# Patient Record
Sex: Male | Born: 1945 | Hispanic: No | Smoking: Current some day smoker
Health system: Southern US, Community
[De-identification: ages and names within clinical notes are randomized; demographics above are authoritative.]

## PROBLEM LIST (undated history)

## (undated) DIAGNOSIS — Z8673 Personal history of transient ischemic attack (TIA), and cerebral infarction without residual deficits: Secondary | ICD-10-CM

## (undated) DIAGNOSIS — J449 Chronic obstructive pulmonary disease, unspecified: Secondary | ICD-10-CM

## (undated) DIAGNOSIS — D72829 Elevated white blood cell count, unspecified: Secondary | ICD-10-CM

## (undated) DIAGNOSIS — I252 Old myocardial infarction: Secondary | ICD-10-CM

## (undated) DIAGNOSIS — E785 Hyperlipidemia, unspecified: Secondary | ICD-10-CM

## (undated) DIAGNOSIS — E1140 Type 2 diabetes mellitus with diabetic neuropathy, unspecified: Secondary | ICD-10-CM

## (undated) DIAGNOSIS — Z86718 Personal history of other venous thrombosis and embolism: Secondary | ICD-10-CM

## (undated) DIAGNOSIS — D638 Anemia in other chronic diseases classified elsewhere: Secondary | ICD-10-CM

## (undated) DIAGNOSIS — I10 Essential (primary) hypertension: Secondary | ICD-10-CM

## (undated) DIAGNOSIS — K5790 Diverticulosis of intestine, part unspecified, without perforation or abscess without bleeding: Secondary | ICD-10-CM

## (undated) HISTORY — DX: Chronic obstructive pulmonary disease, unspecified: J44.9

## (undated) HISTORY — DX: Personal history of transient ischemic attack (TIA), and cerebral infarction without residual deficits: Z86.73

## (undated) HISTORY — DX: Diverticulosis of intestine, part unspecified, without perforation or abscess without bleeding: K57.90

## (undated) HISTORY — DX: Personal history of other venous thrombosis and embolism: Z86.718

## (undated) HISTORY — PX: EYE SURGERY: SHX253

## (undated) HISTORY — DX: Old myocardial infarction: I25.2

---

## 1998-10-15 ENCOUNTER — Encounter: Admission: RE | Admit: 1998-10-15 | Discharge: 1998-12-24 | Payer: Self-pay

## 2004-05-15 ENCOUNTER — Encounter: Admission: RE | Admit: 2004-05-15 | Discharge: 2004-05-15 | Payer: Self-pay | Admitting: Orthopedic Surgery

## 2004-09-16 ENCOUNTER — Encounter: Admission: RE | Admit: 2004-09-16 | Discharge: 2004-09-16 | Payer: Self-pay | Admitting: Family Medicine

## 2004-09-30 ENCOUNTER — Encounter: Admission: RE | Admit: 2004-09-30 | Discharge: 2004-09-30 | Payer: Self-pay | Admitting: Family Medicine

## 2004-11-04 ENCOUNTER — Encounter: Admission: RE | Admit: 2004-11-04 | Discharge: 2004-11-04 | Payer: Self-pay | Admitting: Family Medicine

## 2005-04-09 ENCOUNTER — Encounter: Admission: RE | Admit: 2005-04-09 | Discharge: 2005-04-09 | Payer: Self-pay | Admitting: Otolaryngology

## 2005-09-17 ENCOUNTER — Encounter: Admission: RE | Admit: 2005-09-17 | Discharge: 2005-09-17 | Payer: Self-pay | Admitting: Family Medicine

## 2006-05-18 ENCOUNTER — Encounter: Admission: RE | Admit: 2006-05-18 | Discharge: 2006-05-18 | Payer: Self-pay | Admitting: Family Medicine

## 2006-07-30 ENCOUNTER — Encounter: Admission: RE | Admit: 2006-07-30 | Discharge: 2006-07-30 | Payer: Self-pay | Admitting: Family Medicine

## 2007-01-12 ENCOUNTER — Ambulatory Visit (HOSPITAL_COMMUNITY): Admission: RE | Admit: 2007-01-12 | Discharge: 2007-01-12 | Payer: Self-pay | Admitting: Family Medicine

## 2007-01-12 ENCOUNTER — Ambulatory Visit: Payer: Self-pay | Admitting: Vascular Surgery

## 2007-03-25 ENCOUNTER — Emergency Department (HOSPITAL_COMMUNITY): Admission: EM | Admit: 2007-03-25 | Discharge: 2007-03-25 | Payer: Self-pay | Admitting: Emergency Medicine

## 2008-03-08 ENCOUNTER — Encounter: Admission: RE | Admit: 2008-03-08 | Discharge: 2008-03-08 | Payer: Self-pay | Admitting: Family Medicine

## 2008-03-27 ENCOUNTER — Ambulatory Visit: Payer: Self-pay | Admitting: Gastroenterology

## 2008-04-05 ENCOUNTER — Ambulatory Visit: Payer: Self-pay | Admitting: Gastroenterology

## 2008-11-26 ENCOUNTER — Ambulatory Visit (HOSPITAL_COMMUNITY): Admission: RE | Admit: 2008-11-26 | Discharge: 2008-11-26 | Payer: Self-pay | Admitting: Family Medicine

## 2009-01-25 ENCOUNTER — Ambulatory Visit: Payer: Self-pay | Admitting: Vascular Surgery

## 2009-09-09 ENCOUNTER — Encounter: Admission: RE | Admit: 2009-09-09 | Discharge: 2009-09-09 | Payer: Self-pay | Admitting: Family Medicine

## 2010-11-08 ENCOUNTER — Emergency Department (HOSPITAL_COMMUNITY)
Admission: EM | Admit: 2010-11-08 | Discharge: 2010-11-08 | Payer: Self-pay | Source: Home / Self Care | Admitting: Emergency Medicine

## 2010-11-24 ENCOUNTER — Encounter
Admission: RE | Admit: 2010-11-24 | Discharge: 2010-11-24 | Payer: Self-pay | Source: Home / Self Care | Attending: Physical Medicine and Rehabilitation | Admitting: Physical Medicine and Rehabilitation

## 2010-11-28 ENCOUNTER — Encounter
Admission: RE | Admit: 2010-11-28 | Discharge: 2010-11-28 | Payer: Self-pay | Source: Home / Self Care | Attending: Physical Medicine and Rehabilitation | Admitting: Physical Medicine and Rehabilitation

## 2010-12-07 ENCOUNTER — Encounter: Payer: Self-pay | Admitting: Orthopedic Surgery

## 2011-03-02 LAB — COMPREHENSIVE METABOLIC PANEL
ALT: 24 U/L (ref 0–53)
AST: 24 U/L (ref 0–37)
Albumin: 4.4 g/dL (ref 3.5–5.2)
Alkaline Phosphatase: 38 U/L — ABNORMAL LOW (ref 39–117)
BUN: 15 mg/dL (ref 6–23)
CO2: 29 mEq/L (ref 19–32)
Calcium: 9.7 mg/dL (ref 8.4–10.5)
Chloride: 105 mEq/L (ref 96–112)
Creatinine, Ser: 0.99 mg/dL (ref 0.4–1.5)
GFR calc Af Amer: >60 mL/min (ref >60)
GFR calc non Af Amer: >60 mL/min (ref >60)
Glucose, Bld: 118 mg/dL — ABNORMAL HIGH (ref 70–99)
Potassium: 4.4 mEq/L (ref 3.5–5.1)
Sodium: 142 mEq/L (ref 135–145)
Total Bilirubin: 0.7 mg/dL (ref 0.3–1.2)
Total Protein: 6.9 g/dL (ref 6.0–8.3)

## 2011-03-02 LAB — CBC
HCT: 42.2 % (ref 39.0–52.0)
Hemoglobin: 14.5 g/dL (ref 13.0–17.0)
MCHC: 34.3 g/dL (ref 30.0–36.0)
MCV: 93.3 fL (ref 78.0–100.0)
Platelets: 351 10*3/uL (ref 150–400)
RBC: 4.52 MIL/uL (ref 4.22–5.81)
RDW: 13.6 % (ref 11.5–15.5)
WBC: 6.1 10*3/uL (ref 4.0–10.5)

## 2011-03-02 LAB — LIPID PANEL
Cholesterol: 216 mg/dL — ABNORMAL HIGH (ref 0–200)
HDL: 63 mg/dL (ref >39)
LDL Cholesterol: 140 mg/dL — ABNORMAL HIGH (ref 0–99)
Total CHOL/HDL Ratio: 3.4 RATIO
Triglycerides: 66 mg/dL (ref <150)
VLDL: 13 mg/dL (ref 0–40)

## 2011-03-02 LAB — TSH: TSH: 2.054 u[IU]/mL (ref 0.350–4.500)

## 2012-03-29 ENCOUNTER — Encounter (HOSPITAL_COMMUNITY): Payer: Self-pay | Admitting: Emergency Medicine

## 2012-03-29 ENCOUNTER — Emergency Department (INDEPENDENT_AMBULATORY_CARE_PROVIDER_SITE_OTHER): Payer: PRIVATE HEALTH INSURANCE

## 2012-03-29 ENCOUNTER — Emergency Department (INDEPENDENT_AMBULATORY_CARE_PROVIDER_SITE_OTHER)
Admission: EM | Admit: 2012-03-29 | Discharge: 2012-03-29 | Disposition: A | Discharge status: Home / Self Care | Payer: PRIVATE HEALTH INSURANCE | Source: Home / Self Care | Attending: Emergency Medicine | Admitting: Emergency Medicine

## 2012-03-29 DIAGNOSIS — S20219A Contusion of unspecified front wall of thorax, initial encounter: Secondary | ICD-10-CM

## 2012-03-29 HISTORY — DX: Essential (primary) hypertension: I10

## 2012-03-29 MED ORDER — HYDROCODONE-ACETAMINOPHEN 5-325 MG PO TABS: 2.0000 | ORAL_TABLET | ORAL | Status: AC | PRN

## 2012-03-29 MED ORDER — HYDROCODONE-ACETAMINOPHEN 5-325 MG PO TABS
ORAL_TABLET | ORAL | Status: AC
  Filled 2012-03-29: qty 2

## 2012-03-29 MED ORDER — HYDROCODONE-ACETAMINOPHEN 5-325 MG PO TABS
2.0000 | ORAL_TABLET | Freq: Once | ORAL | Status: AC
  Administered 2012-03-29: 2 via ORAL

## 2012-03-29 NOTE — ED Notes (Signed)
PT HERE WITH LEFT CHEST WALL DISCOMFORT RADIATING TO STERNUM THAT WORSENS WITH LIFTING S/P FALL Sunday.STATES HE TRIPPED AND FELL WHILE CARRYING DOGS AND BRACED FALL WITH FIST AND FELL DIRECTLY ON GROUND.SOB AND PAIN WITH COUGHING.APPEARS STABLE AT THIS TIME.VSS

## 2012-03-29 NOTE — ED Provider Notes (Signed)
Medical screening examination/treatment/procedure(s) were performed by non-physician practitioner and as supervising physician I was immediately available for consultation/collaboration.  Leslee Home, M.D.   Reuben Likes, MD 03/29/12 [phone]

## 2012-03-29 NOTE — Discharge Instructions (Signed)
Chest Contusion A contusion is a deep bruise. Bruises happen when an injury causes bleeding under the skin. Signs of bruising include pain, puffiness (swelling), and discolored skin. The bruise may turn blue, purple, or yellow. Pay attention to how you are doing. HOME CARE  Put ice on the injured area.   Put ice in a plastic bag.   Place a towel between the skin and the bag.   Leave the ice on for 15 to 20 minutes at a time, 3 to 4 times a day for the first 48 hours.   Rest.   Do not lift anything heavy.   Limit your activity as told by your doctor   Take 3 to 4 deep breaths every hour while awake. Hold your hand or a pillow over the sore area for support.   Breathe from the belly (abdomen).   Breathe in through the nose, as if you are smelling a flower.   Breathe out through the mouth, as if you are blowing out a candle.   Only take medicine as told by your doctor.  GET HELP RIGHT AWAY IF:   You have trouble breathing or cough up thick spit (mucus).   You have chest pain that goes into the arms or jaw.   The skin is wet and pale.   You have a fever.   You feel dizzy, weak, or pass out (faint).   You cannot breathe easily.   The bruise is getting worse.  MAKE SURE YOU:   Understand these instructions.   Will watch your condition.   Will get help right away if you are not doing well or get worse.  Document Released: 04/20/2008 Document Revised: 10/22/2011 Document Reviewed: 04/20/2008 [employer] Patient Information 2012 [employer]. 

## 2012-03-29 NOTE — ED Provider Notes (Signed)
History     CSN: [id number]  Arrival date & time 03/29/12  1230   First MD Initiated Contact with Patient 03/29/12 1407      Chief Complaint  Patient presents with  . Fall  . Chest Injury    (Consider location/radiation/quality/duration/timing/severity/associated sxs/prior treatment) Patient is a 66 y.o. male presenting with chest pain. The history is provided by the patient. No language interpreter was used.  Chest Pain The chest pain began yesterday. Chest pain occurs constantly. The quality of the pain is described as aching.   Pt was walking his dog yesterday and fell.  Pt reports he landed on his fist and hit left anterior chest.  Pt complains of pain in ribs where he hit.    Past Medical History  Diagnosis Date  . Hypertension   . Diabetes mellitus   . Acute MI     Past Surgical History  Procedure Date  . Eye surgery     No family history on file.  History  Substance Use Topics  . Smoking status: Current Everyday Smoker  . Smokeless tobacco: Not on file  . Alcohol Use: No      Review of Systems  Cardiovascular: Positive for chest pain.  All other systems reviewed and are negative.    Allergies  Codeine  Home Medications  No current outpatient prescriptions on file.  BP 132/80  Pulse 95  Temp(Src) 97.8 F (36.6 C) (Oral)  Resp 16  SpO2 98%  Physical Exam  Nursing note and vitals reviewed. Constitutional: He is oriented to person, place, and time. He appears well-developed and well-nourished.  HENT:  Head: Normocephalic and atraumatic.  Neck: Normal range of motion.  Cardiovascular: Normal rate, regular rhythm and normal heart sounds.   Pulmonary/Chest: Effort normal and breath sounds normal. He exhibits tenderness.       Tender left anterior ribs  Musculoskeletal: Normal range of motion.  Neurological: He is alert and oriented to person, place, and time. He has normal reflexes.    ED Course  Procedures (including critical care  time)  Labs Reviewed - No data to display No results found.   No diagnosis found. Pt reports not pain like his heart,  Pain with palpation and pain like broken rib   MDM  Left ribs,  No obvious fx.   Pt given hydrocodone for pain.  Pt advised to follow up with his MD for recheck in 3-4 days        Lonia Skinner [place], [place] 03/29/12 1456

## 2012-10-10 ENCOUNTER — Other Ambulatory Visit: Payer: Self-pay | Admitting: Pain Medicine

## 2012-10-10 DIAGNOSIS — M545 Low back pain, unspecified: Secondary | ICD-10-CM

## 2012-10-18 ENCOUNTER — Ambulatory Visit
Admission: RE | Admit: 2012-10-18 | Discharge: 2012-10-18 | Disposition: A | Discharge status: Home / Self Care | Payer: PRIVATE HEALTH INSURANCE | Source: Ambulatory Visit | Attending: Pain Medicine | Admitting: Pain Medicine

## 2012-10-18 DIAGNOSIS — M545 Low back pain, unspecified: Secondary | ICD-10-CM

## 2013-03-28 ENCOUNTER — Encounter: Payer: Self-pay | Admitting: Gastroenterology

## 2013-05-01 ENCOUNTER — Other Ambulatory Visit (HOSPITAL_COMMUNITY): Payer: Self-pay | Admitting: Pulmonary Disease

## 2013-05-01 DIAGNOSIS — R310 Gross hematuria: Secondary | ICD-10-CM

## 2013-05-01 DIAGNOSIS — R319 Hematuria, unspecified: Secondary | ICD-10-CM

## 2013-05-03 ENCOUNTER — Ambulatory Visit (HOSPITAL_COMMUNITY)
Admission: RE | Admit: 2013-05-03 | Discharge: 2013-05-03 | Disposition: A | Discharge status: Home / Self Care | Payer: Medicare HMO | Source: Ambulatory Visit | Attending: Pulmonary Disease | Admitting: Pulmonary Disease

## 2013-05-03 DIAGNOSIS — R310 Gross hematuria: Secondary | ICD-10-CM

## 2013-05-18 ENCOUNTER — Other Ambulatory Visit: Payer: Self-pay | Admitting: Family Medicine

## 2013-05-18 ENCOUNTER — Ambulatory Visit
Admission: RE | Admit: 2013-05-18 | Discharge: 2013-05-18 | Disposition: A | Discharge status: Home / Self Care | Payer: PRIVATE HEALTH INSURANCE | Source: Ambulatory Visit | Attending: Family Medicine | Admitting: Family Medicine

## 2013-05-18 DIAGNOSIS — I10 Essential (primary) hypertension: Secondary | ICD-10-CM

## 2014-02-11 ENCOUNTER — Emergency Department (INDEPENDENT_AMBULATORY_CARE_PROVIDER_SITE_OTHER)
Admission: EM | Admit: 2014-02-11 | Discharge: 2014-02-11 | Disposition: A | Discharge status: Home / Self Care | Payer: PRIVATE HEALTH INSURANCE | Source: Home / Self Care | Attending: Family Medicine | Admitting: Family Medicine

## 2014-02-11 ENCOUNTER — Encounter (HOSPITAL_COMMUNITY): Payer: Self-pay | Admitting: Emergency Medicine

## 2014-02-11 DIAGNOSIS — B370 Candidal stomatitis: Secondary | ICD-10-CM

## 2014-02-11 LAB — POCT RAPID STREP A: Streptococcus, Group A Screen (Direct): NEGATIVE

## 2014-02-11 MED ORDER — NYSTATIN 100000 UNIT/ML MT SUSP: 500000.0000 [IU] | Freq: Four times a day (QID) | OROMUCOSAL | Status: DC

## 2014-02-11 NOTE — Discharge Instructions (Signed)
Thank you for coming in today. Swish and swallow nystatin liquid 4 times daily for 7 days.  Followup with primary care Dr. Reyes IvanWash your mouth out after he used the Symbicort inhaler Candida Infection, Adult A candida infection (also called yeast, fungus and Monilia infection) is an overgrowth of yeast that can occur anywhere on the body. A yeast infection commonly occurs in warm, moist body areas. Usually, the infection remains localized but can spread to become a systemic infection. A yeast infection may be a sign of a more severe disease such as diabetes, leukemia, or AIDS. A yeast infection can occur in both men and women. In women, Candida vaginitis is a vaginal infection. It is one of the most common causes of vaginitis. Men usually do not have symptoms or know they have an infection until other problems develop. Men may find out they have a yeast infection because their sex partner has a yeast infection. Uncircumcised men are more likely to get a yeast infection than circumcised men. This is because the uncircumcised glans is not exposed to air and does not remain as dry as that of a circumcised glans. Older adults may develop yeast infections around dentures. CAUSES  Women  Antibiotics.  Steroid medication taken for a long time.  Being overweight (obese).  Diabetes.  Poor immune condition.  Certain serious medical conditions.  Immune suppressive medications for organ transplant patients.  Chemotherapy.  Pregnancy.  Menstration.  Stress and fatigue.  Intravenous drug use.  Oral contraceptives.  Wearing tight-fitting clothes in the crotch area.  Catching it from a sex partner who has a yeast infection.  Spermicide.  Intravenous, urinary, or other catheters. Men  Catching it from a sex partner who has a yeast infection.  Having oral or anal sex with a person who has the infection.  Spermicide.  Diabetes.  Antibiotics.  Poor immune system.  Medications that  suppress the immune system.  Intravenous drug use.  Intravenous, urinary, or other catheters. SYMPTOMS  Women  Thick, white vaginal discharge.  Vaginal itching.  Redness and swelling in and around the vagina.  Irritation of the lips of the vagina and perineum.  Blisters on the vaginal lips and perineum.  Painful sexual intercourse.  Low blood sugar (hypoglycemia).  Painful urination.  Bladder infections.  Intestinal problems such as constipation, indigestion, bad breath, bloating, increase in gas, diarrhea, or loose stools. Men  Men may develop intestinal problems such as constipation, indigestion, bad breath, bloating, increase in gas, diarrhea, or loose stools.  Dry, cracked skin on the penis with itching or discomfort.  Jock itch.  Dry, flaky skin.  Athlete's foot.  Hypoglycemia. DIAGNOSIS  Women  A history and an exam are performed.  The discharge may be examined under a microscope.  A culture may be taken of the discharge. Men  A history and an exam are performed.  Any discharge from the penis or areas of cracked skin will be looked at under the microscope and cultured.  Stool samples may be cultured. TREATMENT  Women  Vaginal antifungal suppositories and creams.  Medicated creams to decrease irritation and itching on the outside of the vagina.  Warm compresses to the perineal area to decrease swelling and discomfort.  Oral antifungal medications.  Medicated vaginal suppositories or cream for repeated or recurrent infections.  Wash and dry the irritation areas before applying the cream.  Eating yogurt with lactobacillus may help with prevention and treatment.  Sometimes painting the vagina with gentian violet solution may help  if creams and suppositories do not work. Men  Antifungal creams and oral antifungal medications.  Sometimes treatment must continue for 30 days after the symptoms go away to prevent recurrence. HOME CARE  INSTRUCTIONS  Women  Use cotton underwear and avoid tight-fitting clothing.  Avoid colored, scented toilet paper and deodorant tampons or pads.  Do not douche.  Keep your diabetes under control.  Finish all the prescribed medications.  Keep your skin clean and dry.  Consume milk or yogurt with lactobacillus active culture regularly. If you get frequent yeast infections and think that is what the infection is, there are over-the-counter medications that you can get. If the infection does not show healing in 3 days, talk to your caregiver.  Tell your sex partner you have a yeast infection. Your partner may need treatment also, especially if your infection does not clear up or recurs. Men  Keep your skin clean and dry.  Keep your diabetes under control.  Finish all prescribed medications.  Tell your sex partner that you have a yeast infection so they can be treated if necessary. SEEK MEDICAL CARE IF:   Your symptoms do not clear up or worsen in one week after treatment.  You have an oral temperature above 102 F (38.9 C).  You have trouble swallowing or eating for a prolonged time.  You develop blisters on and around your vagina.  You develop vaginal bleeding and it is not your menstrual period.  You develop abdominal pain.  You develop intestinal problems as mentioned above.  You get weak or lightheaded.  You have painful or increased urination.  You have pain during sexual intercourse. MAKE SURE YOU:   Understand these instructions.  Will watch your condition.  Will get help right away if you are not doing well or get worse. Document Released: 12/10/2004 Document Revised: 01/25/2012 Document Reviewed: 03/24/2010 [facility] Patient Information 2014 [place], [place].

## 2014-02-11 NOTE — ED Provider Notes (Signed)
Gregory LoronSanford Walker is a 68 y.o. male who presents to Urgent Care today for burning irritation of the tongue and throat. This is been present for one week. Patient has tried Claritin and Chloraseptic spray which have not helped. He denies any fevers or chills nausea vomiting or diarrhea. He denies any trouble swallowing or breathing. He is a history of COPD and uses Symbicort inhaler. He does not wash his mouth out after using the inhaler.   Past Medical History  Diagnosis Date  . Hypertension   . Diabetes mellitus   . Acute MI    History  Substance Use Topics  . Smoking status: Current Every Day Smoker  . Smokeless tobacco: Not on file  . Alcohol Use: No   ROS as above Medications: No current facility-administered medications for this encounter.   Current Outpatient Prescriptions  Medication Sig Dispense Refill  . nystatin (MYCOSTATIN) 100000 UNIT/ML suspension Take 5 mLs (500,000 Units total) by mouth 4 (four) times daily. 7 days  180 mL  0    Exam:  BP 132/68  Pulse 80  Temp(Src) 97.9 F (36.6 C) (Oral)  Resp 16  SpO2 100% Gen: Well NAD HEENT: EOMI,  MMM tongue is erythematous without plaque. Posterior pharynx is normal appearing Lungs: Normal work of breathing. CTABL Heart: RRR no MRG Abd: NABS, Soft. NT, ND Exts: Brisk capillary refill, warm and well perfused.   Results for orders placed during the hospital encounter of 02/11/14 (from the past 24 hour(s))  POCT RAPID STREP A ([facility] ONLY)     Status: None   Collection Time    02/11/14  3:58 PM      Result Value Ref Range   Streptococcus, Group A Screen (Direct) NEGATIVE  NEGATIVE   No results found.  Assessment and Plan: 68 y.o. male with oral thrush. Plan to treat with nystatin swish and swallow. Wash mouth out after using Symbicort. Followup with primary care provider.  Discussed warning signs or symptoms. Please see discharge instructions. Patient expresses understanding.    Gregory BongEvan S Jordane Hisle, MD 02/11/14  [phone]

## 2014-02-11 NOTE — ED Notes (Signed)
Complains of mouth pain and sore throat for almost a week

## 2014-02-13 LAB — CULTURE, GROUP A STREP

## 2014-06-14 ENCOUNTER — Encounter: Payer: Self-pay | Admitting: Gastroenterology

## 2014-10-27 ENCOUNTER — Emergency Department (INDEPENDENT_AMBULATORY_CARE_PROVIDER_SITE_OTHER)
Admission: EM | Admit: 2014-10-27 | Discharge: 2014-10-27 | Disposition: A | Discharge status: Home / Self Care | Payer: PRIVATE HEALTH INSURANCE | Source: Home / Self Care | Attending: Family Medicine | Admitting: Family Medicine

## 2014-10-27 ENCOUNTER — Encounter (HOSPITAL_COMMUNITY): Payer: Self-pay | Admitting: Emergency Medicine

## 2014-10-27 DIAGNOSIS — T148 Other injury of unspecified body region: Secondary | ICD-10-CM | POA: Diagnosis not present

## 2014-10-27 DIAGNOSIS — T148XXA Other injury of unspecified body region, initial encounter: Secondary | ICD-10-CM

## 2014-10-27 MED ORDER — TETANUS-DIPHTH-ACELL PERTUSSIS 5-2.5-18.5 LF-MCG/0.5 IM SUSP
0.5000 mL | Freq: Once | INTRAMUSCULAR | Status: AC
  Administered 2014-10-27: 0.5 mL via INTRAMUSCULAR

## 2014-10-27 MED ORDER — TETANUS-DIPHTHERIA TOXOIDS TD 5-2 LFU IM INJ
INJECTION | INTRAMUSCULAR | Status: AC
  Filled 2014-10-27: qty 0.5

## 2014-10-27 MED ORDER — TETANUS-DIPHTH-ACELL PERTUSSIS 5-2.5-18.5 LF-MCG/0.5 IM SUSP
INTRAMUSCULAR | Status: AC
  Filled 2014-10-27: qty 0.5

## 2014-10-27 NOTE — ED Notes (Signed)
Pt states that he fell 1 to 2 weeks ago over wood and fell and hit his right leg. Which formed a knot that is still present and painful.

## 2014-10-27 NOTE — Discharge Instructions (Signed)
Warm soak 1-2 times daily. advil for soreness. Return as needed.

## 2014-10-27 NOTE — ED Provider Notes (Signed)
CSN: [id number]     Arrival date & time 10/27/14  1314 History   First MD Initiated Contact with Patient 10/27/14 1325     Chief Complaint  Patient presents with  . Leg Injury  . Leg Pain   (Consider location/radiation/quality/duration/timing/severity/associated sxs/prior Treatment) Patient is a 68 y.o. male presenting with leg pain. The history is provided by the patient.  Leg Pain Location:  Leg Injury: yes   Mechanism of injury comment:  Struck leg on piece of wood from woodpile, swelling has been resolving. Leg location:  R lower leg Pain details:    Quality:  Dull   Radiates to:  Does not radiate   Severity:  Mild   Onset quality:  Gradual Chronicity:  New Dislocation: no   Tetanus status:  Out of date Prior injury to area:  No Associated symptoms: no fever, no numbness and no stiffness     Past Medical History  Diagnosis Date  . Hypertension   . Diabetes mellitus   . Acute MI    Past Surgical History  Procedure Laterality Date  . Eye surgery     History reviewed. No pertinent family history. History  Substance Use Topics  . Smoking status: Current Every Day Smoker  . Smokeless tobacco: Not on file  . Alcohol Use: No    Review of Systems  Constitutional: Negative for fever.  Musculoskeletal: Negative.  Negative for stiffness.  Skin: Positive for wound.    Allergies  Codeine  Home Medications   Prior to Admission medications   Medication Sig Start Date End Date Taking? Authorizing Provider  nystatin (MYCOSTATIN) 100000 UNIT/ML suspension Take 5 mLs (500,000 Units total) by mouth 4 (four) times daily. 7 days 02/11/14   Rodolph BongEvan S Corey, MD   BP 183/70 mmHg  Pulse 88  Temp(Src) 98 F (36.7 C) (Oral)  Resp 18  SpO2 97% Physical Exam  Constitutional: He is oriented to person, place, and time. He appears well-developed and well-nourished. No distress.  Musculoskeletal: He exhibits no tenderness.  Neurological: He is alert and oriented to person, place,  and time.  Skin: Skin is warm and dry. No erythema.  1cm soft resolving hematoma to pretibial skin over right lower leg, sl mobile, nontender., no infection.  Nursing note and vitals reviewed.   ED Course  Procedures (including critical care time) Labs Review Labs Reviewed - No data to display  Imaging Review No results found.   MDM   1. Hematoma and contusion        Linna HoffJames D Caniya Tagle, MD 10/27/14 1353

## 2015-06-08 ENCOUNTER — Observation Stay (HOSPITAL_COMMUNITY)
Admission: EM | Admit: 2015-06-08 | Discharge: 2015-06-10 | Disposition: A | Discharge status: Home / Self Care | Payer: Medicare Other | Attending: Internal Medicine | Admitting: Internal Medicine

## 2015-06-08 ENCOUNTER — Encounter (HOSPITAL_COMMUNITY): Payer: Self-pay | Admitting: Nurse Practitioner

## 2015-06-08 ENCOUNTER — Emergency Department (HOSPITAL_COMMUNITY): Payer: Medicare Other

## 2015-06-08 DIAGNOSIS — R0902 Hypoxemia: Secondary | ICD-10-CM | POA: Diagnosis present

## 2015-06-08 DIAGNOSIS — Z794 Long term (current) use of insulin: Secondary | ICD-10-CM | POA: Diagnosis not present

## 2015-06-08 DIAGNOSIS — E11649 Type 2 diabetes mellitus with hypoglycemia without coma: Secondary | ICD-10-CM | POA: Diagnosis not present

## 2015-06-08 DIAGNOSIS — N400 Enlarged prostate without lower urinary tract symptoms: Secondary | ICD-10-CM | POA: Insufficient documentation

## 2015-06-08 DIAGNOSIS — Z8673 Personal history of transient ischemic attack (TIA), and cerebral infarction without residual deficits: Secondary | ICD-10-CM | POA: Diagnosis not present

## 2015-06-08 DIAGNOSIS — E119 Type 2 diabetes mellitus without complications: Secondary | ICD-10-CM | POA: Diagnosis not present

## 2015-06-08 DIAGNOSIS — I10 Essential (primary) hypertension: Secondary | ICD-10-CM

## 2015-06-08 DIAGNOSIS — Z87891 Personal history of nicotine dependence: Secondary | ICD-10-CM | POA: Diagnosis not present

## 2015-06-08 DIAGNOSIS — Z8249 Family history of ischemic heart disease and other diseases of the circulatory system: Secondary | ICD-10-CM | POA: Insufficient documentation

## 2015-06-08 DIAGNOSIS — E785 Hyperlipidemia, unspecified: Secondary | ICD-10-CM | POA: Insufficient documentation

## 2015-06-08 DIAGNOSIS — J189 Pneumonia, unspecified organism: Principal | ICD-10-CM | POA: Diagnosis present

## 2015-06-08 DIAGNOSIS — F17210 Nicotine dependence, cigarettes, uncomplicated: Secondary | ICD-10-CM | POA: Insufficient documentation

## 2015-06-08 DIAGNOSIS — I252 Old myocardial infarction: Secondary | ICD-10-CM | POA: Insufficient documentation

## 2015-06-08 LAB — I-STAT ARTERIAL BLOOD GAS, ED
Acid-base deficit: 2 mmol/L (ref 0.0–2.0)
Bicarbonate: 22.7 mEq/L (ref 20.0–24.0)
O2 Saturation: 93 %
PCO2 ART: 35.3 mmHg (ref 35.0–45.0)
PH ART: 7.416 (ref 7.350–7.450)
TCO2: 24 mmol/L (ref 0–100)
pO2, Arterial: 67 mmHg — ABNORMAL LOW (ref 80.0–100.0)

## 2015-06-08 LAB — BASIC METABOLIC PANEL
ANION GAP: 10 (ref 5–15)
BUN: 18 mg/dL (ref 6–20)
CALCIUM: 9 mg/dL (ref 8.9–10.3)
CO2: 22 mmol/L (ref 22–32)
CREATININE: 0.93 mg/dL (ref 0.61–1.24)
Chloride: 107 mmol/L (ref 101–111)
GFR calc Af Amer: >60 mL/min (ref >60)
Glucose, Bld: 70 mg/dL (ref 65–99)
Potassium: 3.7 mmol/L (ref 3.5–5.1)
SODIUM: 139 mmol/L (ref 135–145)

## 2015-06-08 LAB — CBC
HEMATOCRIT: 30 % — AB (ref 39.0–52.0)
HEMOGLOBIN: 9.7 g/dL — AB (ref 13.0–17.0)
MCH: 29.1 pg (ref 26.0–34.0)
MCHC: 32.3 g/dL (ref 30.0–36.0)
MCV: 90.1 fL (ref 78.0–100.0)
Platelets: 371 10*3/uL (ref 150–400)
RBC: 3.33 MIL/uL — ABNORMAL LOW (ref 4.22–5.81)
RDW: 15 % (ref 11.5–15.5)
WBC: 5.3 10*3/uL (ref 4.0–10.5)

## 2015-06-08 LAB — I-STAT TROPONIN, ED: TROPONIN I, POC: 0 ng/mL (ref 0.00–0.08)

## 2015-06-08 LAB — CBG MONITORING, ED
GLUCOSE-CAPILLARY: 47 mg/dL — AB (ref 65–99)
GLUCOSE-CAPILLARY: 79 mg/dL (ref 65–99)

## 2015-06-08 LAB — D-DIMER, QUANTITATIVE (NOT AT ARMC): D-Dimer, Quant: 1.2 ug/mL-FEU — ABNORMAL HIGH (ref 0.00–0.48)

## 2015-06-08 LAB — POC OCCULT BLOOD, ED: FECAL OCCULT BLD: NEGATIVE

## 2015-06-08 LAB — BRAIN NATRIURETIC PEPTIDE: B NATRIURETIC PEPTIDE 5: 176.7 pg/mL — AB (ref 0.0–100.0)

## 2015-06-08 MED ORDER — CILOSTAZOL 100 MG PO TABS
100.0000 mg | ORAL_TABLET | Freq: Two times a day (BID) | ORAL | Status: DC
  Administered 2015-06-08 – 2015-06-10 (×4): 100 mg via ORAL
  Filled 2015-06-08 (×6): qty 1

## 2015-06-08 MED ORDER — ENOXAPARIN SODIUM 40 MG/0.4ML ~~LOC~~ SOLN
40.0000 mg | Freq: Every day | SUBCUTANEOUS | Status: DC
  Administered 2015-06-09 – 2015-06-10 (×2): 40 mg via SUBCUTANEOUS
  Filled 2015-06-08 (×2): qty 0.4

## 2015-06-08 MED ORDER — IOHEXOL 350 MG/ML SOLN
80.0000 mL | Freq: Once | INTRAVENOUS | Status: AC | PRN
  Administered 2015-06-08: 80 mL via INTRAVENOUS

## 2015-06-08 MED ORDER — IRBESARTAN 150 MG PO TABS
150.0000 mg | ORAL_TABLET | Freq: Every day | ORAL | Status: DC
  Administered 2015-06-09 – 2015-06-10 (×2): 150 mg via ORAL
  Filled 2015-06-08 (×2): qty 1

## 2015-06-08 MED ORDER — DEXTROSE 5 % IV SOLN: 500.0000 mg | Freq: Once | INTRAVENOUS | Status: DC

## 2015-06-08 MED ORDER — VALSARTAN-HYDROCHLOROTHIAZIDE 160-25 MG PO TABS: 1.0000 | ORAL_TABLET | Freq: Every day | ORAL | Status: DC

## 2015-06-08 MED ORDER — HYDROCHLOROTHIAZIDE 25 MG PO TABS
25.0000 mg | ORAL_TABLET | Freq: Every day | ORAL | Status: DC
  Administered 2015-06-09 – 2015-06-10 (×2): 25 mg via ORAL
  Filled 2015-06-08 (×2): qty 1

## 2015-06-08 MED ORDER — EZETIMIBE 10 MG PO TABS
10.0000 mg | ORAL_TABLET | Freq: Every day | ORAL | Status: DC
  Administered 2015-06-09 – 2015-06-10 (×2): 10 mg via ORAL
  Filled 2015-06-08 (×2): qty 1

## 2015-06-08 MED ORDER — FENOFIBRATE 160 MG PO TABS
160.0000 mg | ORAL_TABLET | Freq: Every day | ORAL | Status: DC
  Administered 2015-06-09 – 2015-06-10 (×2): 160 mg via ORAL
  Filled 2015-06-08 (×2): qty 1

## 2015-06-08 MED ORDER — SODIUM CHLORIDE 0.9 % IV SOLN
INTRAVENOUS | Status: DC
  Administered 2015-06-08: 23:00:00 via INTRAVENOUS

## 2015-06-08 MED ORDER — INSULIN ASPART 100 UNIT/ML ~~LOC~~ SOLN: 0.0000 [IU] | Freq: Three times a day (TID) | SUBCUTANEOUS | Status: DC

## 2015-06-08 MED ORDER — ALPRAZOLAM 0.5 MG PO TABS
0.5000 mg | ORAL_TABLET | Freq: Two times a day (BID) | ORAL | Status: DC
  Administered 2015-06-08 – 2015-06-10 (×4): 0.5 mg via ORAL
  Filled 2015-06-08 (×4): qty 1

## 2015-06-08 MED ORDER — DEXTROSE 5 % IV SOLN
1.0000 g | Freq: Once | INTRAVENOUS | Status: AC
  Administered 2015-06-09: 1 g via INTRAVENOUS
  Filled 2015-06-08: qty 10

## 2015-06-08 MED ORDER — FENOFIBRIC ACID 135 MG PO CPDR: 135.0000 mg | DELAYED_RELEASE_CAPSULE | Freq: Every day | ORAL | Status: DC

## 2015-06-08 MED ORDER — BUDESONIDE-FORMOTEROL FUMARATE 80-4.5 MCG/ACT IN AERO
2.0000 | INHALATION_SPRAY | Freq: Two times a day (BID) | RESPIRATORY_TRACT | Status: DC
  Administered 2015-06-09 – 2015-06-10 (×3): 2 via RESPIRATORY_TRACT
  Filled 2015-06-08: qty 6.9

## 2015-06-08 MED ORDER — ASPIRIN 81 MG PO CHEW
81.0000 mg | CHEWABLE_TABLET | Freq: Every day | ORAL | Status: DC
  Administered 2015-06-09 – 2015-06-10 (×2): 81 mg via ORAL
  Filled 2015-06-08 (×2): qty 1

## 2015-06-08 MED ORDER — PIOGLITAZONE HCL-METFORMIN HCL 15-850 MG PO TABS: 1.0000 | ORAL_TABLET | Freq: Every day | ORAL | Status: DC

## 2015-06-08 MED ORDER — CEFTRIAXONE SODIUM IN DEXTROSE 20 MG/ML IV SOLN
1.0000 g | INTRAVENOUS | Status: DC
  Administered 2015-06-09: 1 g via INTRAVENOUS
  Filled 2015-06-08 (×2): qty 50

## 2015-06-08 MED ORDER — SIMVASTATIN 10 MG PO TABS
10.0000 mg | ORAL_TABLET | Freq: Every day | ORAL | Status: DC
  Administered 2015-06-08 – 2015-06-09 (×2): 10 mg via ORAL
  Filled 2015-06-08 (×2): qty 1

## 2015-06-08 MED ORDER — AZITHROMYCIN 500 MG PO TABS
500.0000 mg | ORAL_TABLET | Freq: Every day | ORAL | Status: DC
  Administered 2015-06-08 – 2015-06-09 (×2): 500 mg via ORAL
  Filled 2015-06-08 (×3): qty 1

## 2015-06-08 NOTE — ED Notes (Signed)
He c/o BLE swelling and burning  x 1 week, applied icy hot with no relief. Hes also been waking at night with a cough and it hurts in his L ribcage when he coughs. He reports SOB with exertion this week. He is A&Ox4, breathing easily now

## 2015-06-08 NOTE — ED Provider Notes (Signed)
CSN: [id number]     Arrival date & time 06/08/15  1502 History   First MD Initiated Contact with Patient 06/08/15 1818     Chief Complaint  Patient presents with  . Leg Swelling     (Consider location/radiation/quality/duration/timing/severity/associated sxs/prior Treatment) HPI  69 year old male presents with bilateral lower extremity swelling for the past 4-5 days. Over the same time he has been having a cough with shortness of breath on exertion. Denies chest pressure or pain except over his inferior left rib. He states that whenever he coughs he gets pain there. Any other time he denies any pain in his chest or that rib. Denies any fevers. Shortness of breath does seem to also worsen when he lays flat. Patient states he has had leg swelling in the past and thinks he is on a fluid pill but does not know the name of it. Patient denies any trauma. No weakness or numbness.  Past Medical History  Diagnosis Date  . Hypertension   . Diabetes mellitus   . Acute MI   . Stroke    Past Surgical History  Procedure Laterality Date  . Eye surgery     History reviewed. No pertinent family history. History  Substance Use Topics  . Smoking status: Current Some Day Smoker  . Smokeless tobacco: Not on file  . Alcohol Use: No    Review of Systems  Respiratory: Positive for cough and shortness of breath.   Cardiovascular: Positive for chest pain (when coughing only) and leg swelling.  Gastrointestinal: Negative for vomiting, abdominal pain and abdominal distention.  All other systems reviewed and are negative.     Allergies  Codeine  Home Medications   Prior to Admission medications   Medication Sig Start Date End Date Taking? Authorizing Provider  nystatin (MYCOSTATIN) 100000 UNIT/ML suspension Take 5 mLs (500,000 Units total) by mouth 4 (four) times daily. 7 days 02/11/14   Rodolph Bong, MD   BP 155/82 mmHg  Pulse 56  Temp(Src) 98.7 F (37.1 C) (Oral)  Resp 15  Ht  (1.676  m)  Wt 216 lb (97.977 kg)  BMI 34.88 kg/m2  SpO2 94% Physical Exam  Constitutional: He is oriented to person, place, and time. He appears well-developed and well-nourished.  HENT:  Head: Normocephalic and atraumatic.  Right Ear: External ear normal.  Left Ear: External ear normal.  Nose: Nose normal.  Eyes: Right eye exhibits no discharge. Left eye exhibits no discharge.  Neck: Neck supple.  Cardiovascular: Normal rate, regular rhythm, normal heart sounds and intact distal pulses.   Pulmonary/Chest: Effort normal. He exhibits tenderness.    Coarse inspiratory breath sounds, clear expiration  Abdominal: Soft. There is no tenderness.  Musculoskeletal: He exhibits edema (Bilateral pitting edema from calves to feet).  Neurological: He is alert and oriented to person, place, and time.  Skin: Skin is warm and dry.  Nursing note and vitals reviewed.   ED Course  Procedures (including critical care time) Labs Review Labs Reviewed  CBC - Abnormal; Notable for the following:    RBC 3.33 (*)    Hemoglobin 9.7 (*)    HCT 30.0 (*)    All other components within normal limits  BRAIN NATRIURETIC PEPTIDE - Abnormal; Notable for the following:    B Natriuretic Peptide 176.7 (*)    All other components within normal limits  D-DIMER, QUANTITATIVE (NOT AT [facility]) - Abnormal; Notable for the following:    D-Dimer, Quant 1.20 (*)    All  other components within normal limits  I-STAT ARTERIAL BLOOD GAS, ED - Abnormal; Notable for the following:    pO2, Arterial 67.0 (*)    All other components within normal limits  CULTURE, BLOOD (ROUTINE X 2)  CULTURE, BLOOD (ROUTINE X 2)  BASIC METABOLIC PANEL  I-STAT TROPOININ, ED  POC OCCULT BLOOD, ED    Imaging Review Dg Chest 2 View  06/08/2015   CLINICAL DATA:  One week history of cough and congestion with left-sided chest pain  EXAM: CHEST  2 VIEW  COMPARISON:  May 18, 2013  FINDINGS: The degree of inspiration shallow. The interstitium appears  borderline prominent, at least in part due to the shallow degree of inspiration. There is no frank edema or consolidation. The heart size and pulmonary vascularity are normal. No adenopathy. No bone lesions.  IMPRESSION: Mild diffuse interstitial prominence, at least in part due to shallow degree of inspiration. No frank edema or consolidation.   Electronically Signed   By: Bretta Bang III M.D.   On: 06/08/2015 16:26   Ct Angio Chest Pe W/cm &/or Wo Cm  06/08/2015   CLINICAL DATA:  69 year old male bilateral lower extremity swelling. Shortness of breath.  EXAM: CT ANGIOGRAPHY CHEST WITH CONTRAST  TECHNIQUE: Multidetector CT imaging of the chest was performed using the standard protocol during bolus administration of intravenous contrast. Multiplanar CT image reconstructions and MIPs were obtained to evaluate the vascular anatomy.  CONTRAST:  80mL OMNIPAQUE IOHEXOL 350 MG/ML SOLN  COMPARISON:  Radiograph dated 06/08/2015 and CT dated 08/01/2013  FINDINGS: There is diffuse increased interstitial prominence compatible with congestive changes . Right upper and lower lobe nodular and ground-glass airspace opacity likely represent superimposed pneumonia. Clinical correlation and follow-up recommended. There is no significant pleural effusion. No pneumothorax. The central airways are patent.  Mild atherosclerotic calcification of the thoracic aorta. All no CT evidence of pulmonary embolism. Top-normal cardiac size. No pericardial effusion. There is coronary vascular calcification. There are bilateral hilar and mediastinal adenopathy.  The esophagus and thyroid gland are grossly unremarkable. The chest wall soft tissues and visualized upper abdomen appear unremarkable. A 2.5 cm hypodense exophytic lesion from the anterior cortex of the left kidney is not well characterized but described as a cyst on CT dated 08/01/2013. There is degenerative changes of the spine. No acute fracture. Old healed right posterior rib  fractures.  Review of the MIP images confirms the above findings.  IMPRESSION: No CT evidence of pulmonary embolism.  Diffuse interstitial prominence with areas of ground-glass and airspace opacity involving the right lung likely representing a degree of congestion with superimposed pneumonia. Clinical correlation and follow-up recommended.   Electronically Signed   By: Elgie Collard M.D.   On: 06/08/2015 20:53     EKG Interpretation   Date/Time:  Saturday June 08 2015 15:59:05 EDT Ventricular Rate:  65 PR Interval:  156 QRS Duration: 94 QT Interval:  436 QTC Calculation: 453 R Axis:   -22 Text Interpretation:  Normal sinus rhythm Normal ECG no significant change  since 2010 Confirmed by Tenita Cue  MD, Kdyn Vonbehren ([id number]) on 06/08/2015 6:18:52 PM      MDM   Final diagnoses:  CAP (community acquired pneumonia)  Hypoxia    Patient appears well here. X-ray with mild interstitial prominence that could be fluid versus shallow inspiration. Patient has borderline low sats at rest, however when he was ambulated his oxygen dropped to 85%. CT shows edema and likely PNA. Given productive cough will treat as CAP (no  admission in over 3 mo) and admit to hospitalist.    Pricilla Loveless, MD 06/08/15 2128

## 2015-06-08 NOTE — ED Notes (Signed)
Patient request Apple juice instead of OJ.

## 2015-06-08 NOTE — Progress Notes (Signed)
PATIENT ARRIVED TO UNIT 5N FROM E.D. AMBULATED FROM STRETCHER TO BATHROOM TO BED.  VITALS OBTAINED. ASSESSMENT PERFORMED. PATIENT ORIENTED TO UNIT AND SOME INTERVENTIONS FOR PLAN OF CARE.  INSTRUCTED TO CALL FOR ASSISTANCE WHEN NEEDED.

## 2015-06-08 NOTE — ED Notes (Addendum)
Gregory Walker in lab states she will add on d-dimer

## 2015-06-08 NOTE — ED Notes (Signed)
Admitting MD made aware of Patient's CBG. Patient given Apple juice (2) and sandwich

## 2015-06-08 NOTE — H&P (Signed)
History and Physical  [name] SHF:[id number] DOB: [birth year] DOA: 06/08/2015  PCP: Leola Brazil, MD   Chief Complaint: lower extremity edema.   HPI: Gregory Walker is a 69 y.o. male with history of HTN, DM, stroke who came with cc of bilateral lower extremity edema and cough with dyspnea at night. He denies any pain in his legs. He said he had that swelling before but usually it goes away unlike this time. He said he has been having non productive cough with dyspnea mainly at night that wake him up. He denied any orthopnea ( uses two pillows) or PND. He denied exertional dyspnea or chest pain. He denied a history of HF or MI. He denied a recent URI , fever or chills but said his girlfriend had pneumonia last week. He denied N/V/D/C/abdominal pain/dysuria/dizziness.     Review of Systems:  12 points review of systems were negative except as per HPI.  Past Medical History  Diagnosis Date  . Hypertension   . Diabetes mellitus   . Acute MI   . Stroke    Past Surgical History  Procedure Laterality Date  . Eye surgery     Social History:  reports that he has been smoking.  He does not have any smokeless tobacco history on file. He reports that he does not drink alcohol or use illicit drugs. Patient lives at  Home with girlfriend.   Allergies  Allergen Reactions  . Codeine Itching    FH:  Dad died of MI.    Prior to Admission medications   Medication Sig Start Date End Date Taking? Authorizing Provider  ALPRAZolam Duanne Moron) 0.5 MG tablet Take 0.5 mg by mouth 2 (two) times daily. 05/15/15  Yes Historical Provider, MD  budesonide-formoterol (SYMBICORT) 80-4.5 MCG/ACT inhaler Inhale 2 puffs into the lungs 2 (two) times daily.   Yes Historical Provider, MD  Choline Fenofibrate (FENOFIBRIC ACID) 135 MG CPDR Take 135 mg by mouth daily.  05/30/15  Yes Historical Provider, MD  cilostazol (PLETAL) 50 MG tablet Take 100 mg by mouth 2 (two) times daily.  06/06/15  Yes  Historical Provider, MD  ezetimibe (ZETIA) 10 MG tablet Take 10 mg by mouth daily.   Yes Historical Provider, MD  fluticasone (FLONASE) 50 MCG/ACT nasal spray Place 2 sprays into both nostrils 2 (two) times daily.  05/30/15  Yes Historical Provider, MD  gabapentin (NEURONTIN) 300 MG capsule Take 600 mg by mouth 2 (two) times daily.  05/30/15  Yes Historical Provider, MD  HUMALOG MIX 75/25 (75-25) 100 UNIT/ML SUSP injection Inject 30 Units into the skin 2 (two) times daily as needed (CBG >124).  03/25/15  Yes Historical Provider, MD  meloxicam (MOBIC) 7.5 MG tablet Take 7.5 mg by mouth 2 (two) times daily. 05/14/15  Yes Historical Provider, MD  oxyCODONE-acetaminophen (PERCOCET) 10-325 MG per tablet Take 1 tablet by mouth every 6 (six) hours as needed for pain.  05/31/15  Yes Historical Provider, MD  pioglitazone-metformin (ACTOPLUS MET) 15-850 MG per tablet Take 1 tablet by mouth at bedtime.  05/14/15  Yes Historical Provider, MD  saxagliptin HCl (ONGLYZA) 5 MG TABS tablet Take 5 mg by mouth daily.   Yes Historical Provider, MD  simvastatin (ZOCOR) 10 MG tablet Take 10 mg by mouth at bedtime.  05/27/15  Yes Historical Provider, MD  tamsulosin (FLOMAX) 0.4 MG CAPS capsule Take 0.4 mg by mouth daily.  06/06/15  Yes Historical Provider, MD  tiZANidine (ZANAFLEX) 2 MG tablet Take 2 mg by mouth  2 (two) times daily.  05/29/15  Yes Historical Provider, MD  traMADol (ULTRAM) 50 MG tablet Take 50 mg by mouth 2 (two) times daily. scheduled 05/31/15  Yes Historical Provider, MD  valsartan-hydrochlorothiazide (DIOVAN-HCT) 160-25 MG per tablet Take 1 tablet by mouth daily.  05/27/15  Yes Historical Provider, MD    Physical Exam: BP 154/73 mmHg  Pulse 69  Temp(Src) 98.7 F (37.1 C) (Oral)  Resp 19  Ht _0  (1.676 m)  Wt 97.977 kg (216 lb)  BMI 34.88 kg/m2  SpO2 95%  General:  In NAD. Eyes: Non icteric.\ ENT: normal mucous membranes.  Neck: supple. No JVD. Cardiovascular: RRR.No M/G/R. Respiratory: crackles in  right lung with decreased breathing sounds.  Abdomen: distended, soft, non tender, BS+ Skin: no rash. Pitting edema in LEs.  Musculoskeletal: No deformity. ROM full Neurologic: alert, O#3, no focal deficits.           Labs on Admission:  Basic Metabolic Panel:  Recent Labs Lab 06/08/15 1601  NA 139  K 3.7  CL 107  CO2 22  GLUCOSE 70  BUN 18  CREATININE 0.93  CALCIUM 9.0   Liver Function Tests: No results for input(s): AST, ALT, ALKPHOS, BILITOT, PROT, ALBUMIN in the last 168 hours. No results for input(s): LIPASE, AMYLASE in the last 168 hours. No results for input(s): AMMONIA in the last 168 hours. CBC:  Recent Labs Lab 06/08/15 1601  WBC 5.3  HGB 9.7*  HCT 30.0*  MCV 90.1  PLT 371   Cardiac Enzymes: No results for input(s): CKTOTAL, CKMB, CKMBINDEX, TROPONINI in the last 168 hours.  BNP (last 3 results)  Recent Labs  06/08/15 1601  BNP 176.7*    ProBNP (last 3 results) No results for input(s): PROBNP in the last 8760 hours.  CBG:  Recent Labs Lab 06/08/15 2141  GLUCAP 47*    Radiological Exams on Admission: Dg Chest 2 View  06/08/2015   CLINICAL DATA:  One week history of cough and congestion with left-sided chest pain  EXAM: CHEST  2 VIEW  COMPARISON:  May 18, 2013  FINDINGS: The degree of inspiration shallow. The interstitium appears borderline prominent, at least in part due to the shallow degree of inspiration. There is no frank edema or consolidation. The heart size and pulmonary vascularity are normal. No adenopathy. No bone lesions.  IMPRESSION: Mild diffuse interstitial prominence, at least in part due to shallow degree of inspiration. No frank edema or consolidation.   Electronically Signed   By: Lowella Grip III M.D.   On: 06/08/2015 16:26   Ct Angio Chest Pe W/cm &/or Wo Cm  06/08/2015   CLINICAL DATA:  69 year old male bilateral lower extremity swelling. Shortness of breath.  EXAM: CT ANGIOGRAPHY CHEST WITH CONTRAST  TECHNIQUE:  Multidetector CT imaging of the chest was performed using the standard protocol during bolus administration of intravenous contrast. Multiplanar CT image reconstructions and MIPs were obtained to evaluate the vascular anatomy.  CONTRAST:  9m OMNIPAQUE IOHEXOL 350 MG/ML SOLN  COMPARISON:  Radiograph dated 06/08/2015 and CT dated 08/01/2013  FINDINGS: There is diffuse increased interstitial prominence compatible with congestive changes . Right upper and lower lobe nodular and ground-glass airspace opacity likely represent superimposed pneumonia. Clinical correlation and follow-up recommended. There is no significant pleural effusion. No pneumothorax. The central airways are patent.  Mild atherosclerotic calcification of the thoracic aorta. All no CT evidence of pulmonary embolism. Top-normal cardiac size. No pericardial effusion. There is coronary vascular calcification. There are bilateral  hilar and mediastinal adenopathy.  The esophagus and thyroid gland are grossly unremarkable. The chest wall soft tissues and visualized upper abdomen appear unremarkable. A 2.5 cm hypodense exophytic lesion from the anterior cortex of the left kidney is not well characterized but described as a cyst on CT dated 08/01/2013. There is degenerative changes of the spine. No acute fracture. Old healed right posterior rib fractures.  Review of the MIP images confirms the above findings.  IMPRESSION: No CT evidence of pulmonary embolism.  Diffuse interstitial prominence with areas of ground-glass and airspace opacity involving the right lung likely representing a degree of congestion with superimposed pneumonia. Clinical correlation and follow-up recommended.   Electronically Signed   By: Anner Crete M.D.   On: 06/08/2015 20:53    EKG: Independently reviewed. Normal sinus rhythm.   Assessment/Plan  Community acquired pneumonia: Sputum cx and blood cx sent.  Started on Ceftriaxone and azithromycin. Legionella and strep ag  in urine sent.  Mild hydration.  LE edema:  Possible venous stasis vs. Diastolic HF Elevated BNP can be falsely high. Will check Echo to r/o diastolic HF.  CTPE neg for PE.   DM: Will check HbA1c  Hypoglycemic in the ER: did not eat today.  Will keep on SS and hold meds for now.   HLD: continue home meds  HTN: continue home meds  H/o CVA: continue asp daily.   Anemia: FOBT neg in stool.  Continue to monitor.   Consultants: None  Code Status: full   Family Communication: updated, at bedside.   Disposition Plan: discharge once oral intake and dyspnea improve.   Gennaro Africa MD [facility]

## 2015-06-09 DIAGNOSIS — J189 Pneumonia, unspecified organism: Secondary | ICD-10-CM

## 2015-06-09 DIAGNOSIS — E119 Type 2 diabetes mellitus without complications: Secondary | ICD-10-CM

## 2015-06-09 DIAGNOSIS — Z8673 Personal history of transient ischemic attack (TIA), and cerebral infarction without residual deficits: Secondary | ICD-10-CM

## 2015-06-09 DIAGNOSIS — I10 Essential (primary) hypertension: Secondary | ICD-10-CM

## 2015-06-09 LAB — COMPREHENSIVE METABOLIC PANEL
ALBUMIN: 3.2 g/dL — AB (ref 3.5–5.0)
ALK PHOS: 37 U/L — AB (ref 38–126)
ALT: 15 U/L — AB (ref 17–63)
AST: 20 U/L (ref 15–41)
Anion gap: 10 (ref 5–15)
BILIRUBIN TOTAL: 0.3 mg/dL (ref 0.3–1.2)
BUN: 13 mg/dL (ref 6–20)
CO2: 23 mmol/L (ref 22–32)
Calcium: 8.8 mg/dL — ABNORMAL LOW (ref 8.9–10.3)
Chloride: 109 mmol/L (ref 101–111)
Creatinine, Ser: 0.83 mg/dL (ref 0.61–1.24)
GFR calc Af Amer: >60 mL/min (ref >60)
Glucose, Bld: 82 mg/dL (ref 65–99)
POTASSIUM: 3.8 mmol/L (ref 3.5–5.1)
Sodium: 142 mmol/L (ref 135–145)
Total Protein: 6.1 g/dL — ABNORMAL LOW (ref 6.5–8.1)

## 2015-06-09 LAB — CBC WITH DIFFERENTIAL/PLATELET
BASOS PCT: 1 % (ref 0–1)
Basophils Absolute: 0 10*3/uL (ref 0.0–0.1)
Eosinophils Absolute: 0.2 10*3/uL (ref 0.0–0.7)
Eosinophils Relative: 3 % (ref 0–5)
HEMATOCRIT: 30.3 % — AB (ref 39.0–52.0)
Hemoglobin: 9.9 g/dL — ABNORMAL LOW (ref 13.0–17.0)
LYMPHS PCT: 29 % (ref 12–46)
Lymphs Abs: 1.6 10*3/uL (ref 0.7–4.0)
MCH: 29.8 pg (ref 26.0–34.0)
MCHC: 32.7 g/dL (ref 30.0–36.0)
MCV: 91.3 fL (ref 78.0–100.0)
MONO ABS: 0.6 10*3/uL (ref 0.1–1.0)
MONOS PCT: 11 % (ref 3–12)
NEUTROS ABS: 3.2 10*3/uL (ref 1.7–7.7)
Neutrophils Relative %: 56 % (ref 43–77)
Platelets: 349 10*3/uL (ref 150–400)
RBC: 3.32 MIL/uL — ABNORMAL LOW (ref 4.22–5.81)
RDW: 15.1 % (ref 11.5–15.5)
WBC: 5.6 10*3/uL (ref 4.0–10.5)

## 2015-06-09 LAB — URINALYSIS, ROUTINE W REFLEX MICROSCOPIC
BILIRUBIN URINE: NEGATIVE
GLUCOSE, UA: NEGATIVE mg/dL
HGB URINE DIPSTICK: NEGATIVE
Ketones, ur: NEGATIVE mg/dL
Leukocytes, UA: NEGATIVE
Nitrite: NEGATIVE
PH: 7 (ref 5.0–8.0)
Protein, ur: NEGATIVE mg/dL
SPECIFIC GRAVITY, URINE: 1.016 (ref 1.005–1.030)
UROBILINOGEN UA: 1 mg/dL (ref 0.0–1.0)

## 2015-06-09 LAB — GLUCOSE, CAPILLARY
GLUCOSE-CAPILLARY: 78 mg/dL (ref 65–99)
GLUCOSE-CAPILLARY: 89 mg/dL (ref 65–99)
GLUCOSE-CAPILLARY: 99 mg/dL (ref 65–99)
Glucose-Capillary: 100 mg/dL — ABNORMAL HIGH (ref 65–99)

## 2015-06-09 LAB — RETICULOCYTES
RBC.: 3.66 MIL/uL — ABNORMAL LOW (ref 4.22–5.81)
RETIC COUNT ABSOLUTE: 109.8 10*3/uL (ref 19.0–186.0)
Retic Ct Pct: 3 % (ref 0.4–3.1)

## 2015-06-09 LAB — TSH: TSH: 0.788 u[IU]/mL (ref 0.350–4.500)

## 2015-06-09 LAB — STREP PNEUMONIAE URINARY ANTIGEN: Strep Pneumo Urinary Antigen: NEGATIVE

## 2015-06-09 MED ORDER — GUAIFENESIN ER 600 MG PO TB12
600.0000 mg | ORAL_TABLET | Freq: Two times a day (BID) | ORAL | Status: DC
  Administered 2015-06-09 – 2015-06-10 (×2): 600 mg via ORAL
  Filled 2015-06-09 (×3): qty 1

## 2015-06-09 MED ORDER — GABAPENTIN 300 MG PO CAPS
600.0000 mg | ORAL_CAPSULE | Freq: Two times a day (BID) | ORAL | Status: DC
  Administered 2015-06-09 – 2015-06-10 (×3): 600 mg via ORAL
  Filled 2015-06-09 (×3): qty 2

## 2015-06-09 MED ORDER — IPRATROPIUM-ALBUTEROL 0.5-2.5 (3) MG/3ML IN SOLN: 3.0000 mL | Freq: Four times a day (QID) | RESPIRATORY_TRACT | Status: DC | PRN

## 2015-06-09 MED ORDER — FLUTICASONE PROPIONATE 50 MCG/ACT NA SUSP
2.0000 | Freq: Two times a day (BID) | NASAL | Status: DC
  Administered 2015-06-10: 2 via NASAL
  Filled 2015-06-09: qty 16

## 2015-06-09 MED ORDER — IPRATROPIUM-ALBUTEROL 0.5-2.5 (3) MG/3ML IN SOLN
3.0000 mL | Freq: Three times a day (TID) | RESPIRATORY_TRACT | Status: DC
  Administered 2015-06-09: 3 mL via RESPIRATORY_TRACT
  Filled 2015-06-09: qty 3

## 2015-06-09 MED ORDER — TAMSULOSIN HCL 0.4 MG PO CAPS
0.4000 mg | ORAL_CAPSULE | Freq: Every day | ORAL | Status: DC
  Administered 2015-06-09 – 2015-06-10 (×2): 0.4 mg via ORAL
  Filled 2015-06-09 (×2): qty 1

## 2015-06-09 NOTE — Progress Notes (Signed)
PROGRESS NOTE  Gregory Walker ZOX:[id number] DOB: 24-Feb-1946 DOA: 06/08/2015 PCP: Eino Farber, MD  HPI/Recap of past 24 hours:  Feeling better, bilateral lower extremity edema subsided  Assessment/Plan: Active Problems:   CAP (community acquired pneumonia)   Diabetes   HTN (hypertension)   Pneumonia  Cough, diffuse interstitial changes on CT chest, chf? Atypical pna? Normal EKG, negative troponin, dose has mildly elevated bnp, Echo pending, on abx. On room air at rest, ambulate check oxygen saturation.  Lower extremity edema: resolved. Monitor volume status. Continue home meds HCTZ.  Cardiac murmur: patient now aware of this PTA, denies chest pain, no palpitation, no syncope. Echo pending  NIDDM2, a1c pending, reported home blood sugar around 90. Oral meds held, On ssi here  HTN: stable on home meds ARB/hctz  HLD: continue statin  Smoker: half pack a day, reported stopped smoking for a week due to cough/sob, declined nicotine patch.  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 Ambulating independently. On pletal/statin/ARb.  Family history of premature CAD, father died of MI at age of 88. Patient denies personal history of MI   Code Status: full  Family Communication: patient  Disposition Plan: home when medically stable   Consultants:  none  Procedures:  CTA chest  Echocardiogram  Antibiotics:  Rocephin/zithromax   Objective: BP 138/58 mmHg  Pulse 67  Temp(Src) 98 F (36.7 C) (Oral)  Resp 17  Ht 5\' 6"  (1.676 m)  Wt 97.977 kg (216 lb)  BMI 34.88 kg/m2  SpO2 98%  Intake/Output Summary (Last 24 hours) at 06/09/15 1421 Last data filed at 06/09/15 1348  Gross per 24 hour  Intake   1590 ml  Output      0 ml  Net   1590 ml   Filed Weights   06/08/15 1547  Weight: 97.977 kg (216 lb)    Exam:   General:  NAD, old healed facial trama on left  temporal/facial area.  Cardiovascular: RRR  Respiratory: bibasilar crackles  Abdomen: Soft/ND/NT, positive BS  Musculoskeletal: No Edema  Neuro: aaox3, mild residual right lower extremity weakness from prior CVA.  Data Reviewed: Basic Metabolic Panel:  Recent Labs Lab 06/08/15 1601 06/09/15 0640  NA 139 142  K 3.7 3.8  CL 107 109  CO2 22 23  GLUCOSE 70 82  BUN 18 13  CREATININE 0.93 0.83  CALCIUM 9.0 8.8*   Liver Function Tests:  Recent Labs Lab 06/09/15 0640  AST 20  ALT 15*  ALKPHOS 37*  BILITOT 0.3  PROT 6.1*  ALBUMIN 3.2*   No results for input(s): LIPASE, AMYLASE in the last 168 hours. No results for input(s): AMMONIA in the last 168 hours. CBC:  Recent Labs Lab 06/08/15 1601 06/09/15 0640  WBC 5.3 5.6  NEUTROABS  --  3.2  HGB 9.7* 9.9*  HCT 30.0* 30.3*  MCV 90.1 91.3  PLT 371 349   Cardiac Enzymes:   No results for input(s): CKTOTAL, CKMB, CKMBINDEX, TROPONINI in the last 168 hours. BNP (last 3 results)  Recent Labs  06/08/15 1601  BNP 176.7*    ProBNP (last 3 results) No results for input(s): PROBNP in the last 8760 hours.  CBG:  Recent Labs Lab 06/08/15 2141 06/08/15 2204 06/09/15 0627 06/09/15 1122  GLUCAP 47* 79 78 100*    No results found for this or any previous visit (from the past 240 hour(s)).   Studies: Dg Chest 2  View  06/08/2015   CLINICAL DATA:  One week history of cough and congestion with left-sided chest pain  EXAM: CHEST  2 VIEW  COMPARISON:  May 18, 2013  FINDINGS: The degree of inspiration shallow. The interstitium appears borderline prominent, at least in part due to the shallow degree of inspiration. There is no frank edema or consolidation. The heart size and pulmonary vascularity are normal. No adenopathy. No bone lesions.  IMPRESSION: Mild diffuse interstitial prominence, at least in part due to shallow degree of inspiration. No frank edema or consolidation.   Electronically Signed   By: Bretta Bang  III M.D.   On: 06/08/2015 16:26   Ct Angio Chest Pe W/cm &/or Wo Cm  06/08/2015   CLINICAL DATA:  69 year old male bilateral lower extremity swelling. Shortness of breath.  EXAM: CT ANGIOGRAPHY CHEST WITH CONTRAST  TECHNIQUE: Multidetector CT imaging of the chest was performed using the standard protocol during bolus administration of intravenous contrast. Multiplanar CT image reconstructions and MIPs were obtained to evaluate the vascular anatomy.  CONTRAST:  80mL OMNIPAQUE IOHEXOL 350 MG/ML SOLN  COMPARISON:  Radiograph dated 06/08/2015 and CT dated 08/01/2013  FINDINGS: There is diffuse increased interstitial prominence compatible with congestive changes . Right upper and lower lobe nodular and ground-glass airspace opacity likely represent superimposed pneumonia. Clinical correlation and follow-up recommended. There is no significant pleural effusion. No pneumothorax. The central airways are patent.  Mild atherosclerotic calcification of the thoracic aorta. All no CT evidence of pulmonary embolism. Top-normal cardiac size. No pericardial effusion. There is coronary vascular calcification. There are bilateral hilar and mediastinal adenopathy.  The esophagus and thyroid gland are grossly unremarkable. The chest wall soft tissues and visualized upper abdomen appear unremarkable. A 2.5 cm hypodense exophytic lesion from the anterior cortex of the left kidney is not well characterized but described as a cyst on CT dated 08/01/2013. There is degenerative changes of the spine. No acute fracture. Old healed right posterior rib fractures.  Review of the MIP images confirms the above findings.  IMPRESSION: No CT evidence of pulmonary embolism.  Diffuse interstitial prominence with areas of ground-glass and airspace opacity involving the right lung likely representing a degree of congestion with superimposed pneumonia. Clinical correlation and follow-up recommended.   Electronically Signed   By: Elgie Collard M.D.    On: 06/08/2015 20:53    Scheduled Meds: . ALPRAZolam  0.5 mg Oral BID  . aspirin  81 mg Oral Daily  . azithromycin  500 mg Oral QHS  . budesonide-formoterol  2 puff Inhalation BID  . cefTRIAXone (ROCEPHIN)  IV  1 g Intravenous Q24H  . cilostazol  100 mg Oral BID  . enoxaparin (LOVENOX) injection  40 mg Subcutaneous Daily  . ezetimibe  10 mg Oral Daily  . fenofibrate  160 mg Oral Daily  . hydrochlorothiazide  25 mg Oral Daily  . insulin aspart  0-9 Units Subcutaneous TID WC  . irbesartan  150 mg Oral Daily  . simvastatin  10 mg Oral QHS    Continuous Infusions:    Time spent:  Lorina Duffner MD, PhD  [facility] Pager [phone]. If 7PM-7AM, please contact night-coverage at [email or web address], password [facility] 06/09/2015, 2:21 PM

## 2015-06-09 NOTE — Progress Notes (Signed)
Preformed ambulatory sats, walked with patient 1 time around unit with O2 sensor on his finger, sats ranged between 98-99%. Patient appeared to tolerate walk very well. Back to bed without difficulty.

## 2015-06-10 ENCOUNTER — Observation Stay (HOSPITAL_BASED_OUTPATIENT_CLINIC_OR_DEPARTMENT_OTHER): Payer: Medicare Other

## 2015-06-10 DIAGNOSIS — I10 Essential (primary) hypertension: Secondary | ICD-10-CM | POA: Diagnosis not present

## 2015-06-10 DIAGNOSIS — Z8673 Personal history of transient ischemic attack (TIA), and cerebral infarction without residual deficits: Secondary | ICD-10-CM | POA: Diagnosis not present

## 2015-06-10 DIAGNOSIS — E119 Type 2 diabetes mellitus without complications: Secondary | ICD-10-CM | POA: Diagnosis not present

## 2015-06-10 DIAGNOSIS — R0600 Dyspnea, unspecified: Secondary | ICD-10-CM | POA: Diagnosis not present

## 2015-06-10 DIAGNOSIS — J189 Pneumonia, unspecified organism: Secondary | ICD-10-CM | POA: Diagnosis not present

## 2015-06-10 LAB — LIPID PANEL
Cholesterol: 137 mg/dL (ref 0–200)
HDL: 37 mg/dL — ABNORMAL LOW
LDL Cholesterol: 81 mg/dL (ref 0–99)
Total CHOL/HDL Ratio: 3.7 ratio
Triglycerides: 94 mg/dL
VLDL: 19 mg/dL (ref 0–40)

## 2015-06-10 LAB — BASIC METABOLIC PANEL
ANION GAP: 7 (ref 5–15)
BUN: 11 mg/dL (ref 6–20)
CALCIUM: 9.3 mg/dL (ref 8.9–10.3)
CO2: 23 mmol/L (ref 22–32)
Chloride: 113 mmol/L — ABNORMAL HIGH (ref 101–111)
Creatinine, Ser: 0.82 mg/dL (ref 0.61–1.24)
GFR calc Af Amer: >60 mL/min (ref >60)
GFR calc non Af Amer: >60 mL/min (ref >60)
GLUCOSE: 135 mg/dL — AB (ref 65–99)
POTASSIUM: 3.5 mmol/L (ref 3.5–5.1)
Sodium: 143 mmol/L (ref 135–145)

## 2015-06-10 LAB — GLUCOSE, CAPILLARY
GLUCOSE-CAPILLARY: 120 mg/dL — AB (ref 65–99)
GLUCOSE-CAPILLARY: 110 mg/dL — AB (ref 65–99)

## 2015-06-10 LAB — LEGIONELLA ANTIGEN, URINE

## 2015-06-10 LAB — CBC
HCT: 30.3 % — ABNORMAL LOW (ref 39.0–52.0)
Hemoglobin: 10 g/dL — ABNORMAL LOW (ref 13.0–17.0)
MCH: 29.9 pg (ref 26.0–34.0)
MCHC: 33 g/dL (ref 30.0–36.0)
MCV: 90.4 fL (ref 78.0–100.0)
Platelets: 373 10*3/uL (ref 150–400)
RBC: 3.35 MIL/uL — ABNORMAL LOW (ref 4.22–5.81)
RDW: 15 % (ref 11.5–15.5)
WBC: 6.2 10*3/uL (ref 4.0–10.5)

## 2015-06-10 LAB — IRON AND TIBC
Iron: 40 ug/dL — ABNORMAL LOW (ref 45–182)
Saturation Ratios: 9 % — ABNORMAL LOW (ref 17.9–39.5)
TIBC: 435 ug/dL (ref 250–450)
UIBC: 395 ug/dL

## 2015-06-10 LAB — HEMOGLOBIN A1C
Hgb A1c MFr Bld: 5.6 % (ref 4.8–5.6)
Mean Plasma Glucose: 114 mg/dL

## 2015-06-10 LAB — MAGNESIUM: Magnesium: 2.2 mg/dL (ref 1.7–2.4)

## 2015-06-10 LAB — BRAIN NATRIURETIC PEPTIDE: B Natriuretic Peptide: 173.7 pg/mL — ABNORMAL HIGH (ref 0.0–100.0)

## 2015-06-10 LAB — VITAMIN B12: Vitamin B-12: 335 pg/mL (ref 180–914)

## 2015-06-10 MED ORDER — DOXYCYCLINE HYCLATE 100 MG PO CAPS
100.0000 mg | ORAL_CAPSULE | Freq: Two times a day (BID) | ORAL | Status: DC
Start: 2015-06-10 — End: 2015-12-28

## 2015-06-10 NOTE — Progress Notes (Signed)
  Echocardiogram 2D Echocardiogram has been performed.  Gregory Walker 06/10/2015, 1:26 PM

## 2015-06-10 NOTE — Discharge Summary (Signed)
Discharge Summary  Gregory Walker KTG:[id number] DOB: 06-26-1946  PCP: Leola Brazil, MD  Admit date: 06/08/2015 Discharge date: 06/10/2015  Time spent: <45mns  Recommendations for Outpatient Follow-up:  1. F/u with PMD DR. ALindwood Quain a week, pmd to continue monitor blood pressure, blood sugar control. pmd to repeat cxr in 2-3 weeks.  Discharge Diagnoses:  Active Hospital Problems   Diagnosis Date Noted  . CAP (community acquired pneumonia) 06/08/2015  . Diabetes 06/08/2015  . HTN (hypertension) 06/08/2015  . Pneumonia 06/08/2015    Resolved Hospital Problems   Diagnosis Date Noted Date Resolved  No resolved problems to display.    Discharge Condition: stable  Diet recommendation: heart healthy/carb modified  Filed Weights   06/08/15 1547 06/10/15 0517  Weight: 97.977 kg (216 lb) 95.346 kg (210 lb 3.2 oz)    History of present illness:  SLasean Gorniakis a 69y.o. male with history of HTN, DM, stroke who came with cc of bilateral lower extremity edema and cough with dyspnea at night. He denies any pain in his legs. He said he had that swelling before but usually it goes away unlike this time. He said he has been having non productive cough with dyspnea mainly at night that wake him up. He denied any orthopnea ( uses two pillows) or PND. He denied exertional dyspnea or chest pain. He denied a history of HF or MI. He denied a recent URI , fever or chills but said his girlfriend had pneumonia last week. He denied N/V/D/C/abdominal pain/dysuria/dizziness.   Hospital Course:  Active Problems:   CAP (community acquired pneumonia)   Diabetes   HTN (hypertension)   Pneumonia  Cough, diffuse interstitial changes on CT chest, chf? Atypical pna? Normal EKG, negative troponin, dose has mildly elevated bnp, Echo pending, on abx. oxygen saturation 98-99% on room air while ambulating. Cough resolved, lung exam crackles resolved, CTABL at discharge, suspect component  of flush pulmonary edema, instructed patient to perform home bp recording and bring in result to PMD, pmd to continue monitor blood pressure control. He is also discharged with oral doxycycline for 5 days, pmd to repeat cxr in 2-3weeks  Bilateral Lower extremity edema: resolved. Monitor volume status. Continue home meds HCTZ.  Cardiac murmur: patient now aware of this PTA, denies chest pain, no palpitation, no syncope. Echo pending, pmd to f/u on echo result.  NIDDM2, a1c pending at time of discharge, reported home blood sugar around 90. Oral meds held, On ssi here. pmd to continue monitor blood sugar control, and f/u on A1c result.  HTN: stable on home meds ARB/hctz  HLD: continue statin  Smoker: half pack a day, reported stopped smoking for a week due to cough/sob, declined nicotine patch.  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 Ambulating independently. On pletal/statin/ARB.  Family history of premature CAD, father died of MI at age of 5729Patient denies personal history of MI   Code Status: full  Family Communication: patient  Disposition Plan: home    Consultants:  none  Procedures:  CTA chest  Echocardiogram  Antibiotics:  Rocephin/zithromax  Discharge Exam: BP 147/81 mmHg  Pulse 56  Temp(Src) 98.2 F (36.8 C) (Oral)  Resp 19  Ht 5' 6"  (1.676 m)  Wt 95.346 kg (210 lb 3.2 oz)  BMI 33.94 kg/m2  SpO2 98%    General: NAD, old healed facial trama on left temporal/facial area.  Cardiovascular: RRR  Respiratory: bibasilar crackles has resolved, CTABL at time of discharge  Abdomen: Soft/ND/NT, positive BS  Musculoskeletal: initial bilateral lower extremity  Edema resolved at time of discharge  Neuro: aaox3, mild residual right lower extremity weakness from prior CVA   Discharge Instructions You were cared for by a hospitalist during your hospital stay. If you have any  questions about your discharge medications or the care you received while you were in the hospital after you are discharged, you can call the unit and asked to speak with the hospitalist on call if the hospitalist that took care of you is not available. Once you are discharged, your primary care physician will handle any further medical issues. Please note that NO REFILLS for any discharge medications will be authorized once you are discharged, as it is imperative that you return to your primary care physician (or establish a relationship with a primary care physician if you do not have one) for your aftercare needs so that they can reassess your need for medications and monitor your lab values.  Discharge Instructions    Diet - low sodium heart healthy    Complete by:  As directed      Increase activity slowly    Complete by:  As directed             Medication List    TAKE these medications        ALPRAZolam 0.5 MG tablet  Commonly known as:  XANAX  Take 0.5 mg by mouth 2 (two) times daily.     budesonide-formoterol 80-4.5 MCG/ACT inhaler  Commonly known as:  SYMBICORT  Inhale 2 puffs into the lungs 2 (two) times daily.     cilostazol 50 MG tablet  Commonly known as:  PLETAL  Take 100 mg by mouth 2 (two) times daily.     doxycycline 100 MG capsule  Commonly known as:  VIBRAMYCIN  Take 1 capsule (100 mg total) by mouth 2 (two) times daily.     ezetimibe 10 MG tablet  Commonly known as:  ZETIA  Take 10 mg by mouth daily.     Fenofibric Acid 135 MG Cpdr  Take 135 mg by mouth daily.     fluticasone 50 MCG/ACT nasal spray  Commonly known as:  FLONASE  Place 2 sprays into both nostrils 2 (two) times daily.     gabapentin 300 MG capsule  Commonly known as:  NEURONTIN  Take 600 mg by mouth 2 (two) times daily.     HUMALOG MIX 75/25 (75-25) 100 UNIT/ML Susp injection  Generic drug:  insulin lispro protamine-lispro  Inject 30 Units into the skin 2 (two) times daily as needed  (CBG >124).     meloxicam 7.5 MG tablet  Commonly known as:  MOBIC  Take 7.5 mg by mouth 2 (two) times daily.     ONGLYZA 5 MG Tabs tablet  Generic drug:  saxagliptin HCl  Take 5 mg by mouth daily.     oxyCODONE-acetaminophen 10-325 MG per tablet  Commonly known as:  PERCOCET  Take 1 tablet by mouth every 6 (six) hours as needed for pain.     pioglitazone-metformin 15-850 MG per tablet  Commonly known as:  ACTOPLUS MET  Take 1 tablet by mouth at bedtime.     simvastatin 10 MG tablet  Commonly known as:  ZOCOR  Take 10 mg by mouth at bedtime.     tamsulosin 0.4 MG Caps capsule  Commonly known as:  FLOMAX  Take 0.4  mg by mouth daily.     tiZANidine 2 MG tablet  Commonly known as:  ZANAFLEX  Take 2 mg by mouth 2 (two) times daily.     traMADol 50 MG tablet  Commonly known as:  ULTRAM  Take 50 mg by mouth 2 (two) times daily. scheduled     valsartan-hydrochlorothiazide 160-25 MG per tablet  Commonly known as:  DIOVAN-HCT  Take 1 tablet by mouth daily.       Allergies  Allergen Reactions  . Codeine Itching       Follow-up Information    Follow up with Elizabeth Palau, MD In 2 weeks.   Specialty:  Family Medicine   Why:  hospital discharge follow up   Contact information:   [address] [place] [address] [phone]        The results of significant diagnostics from this hospitalization (including imaging, microbiology, ancillary and laboratory) are listed below for reference.    Significant Diagnostic Studies: Dg Chest 2 View  06/08/2015   CLINICAL DATA:  One week history of cough and congestion with left-sided chest pain  EXAM: CHEST  2 VIEW  COMPARISON:  May 18, 2013  FINDINGS: The degree of inspiration shallow. The interstitium appears borderline prominent, at least in part due to the shallow degree of inspiration. There is no frank edema or consolidation. The heart size and pulmonary vascularity are normal. No adenopathy. No bone  lesions.  IMPRESSION: Mild diffuse interstitial prominence, at least in part due to shallow degree of inspiration. No frank edema or consolidation.   Electronically Signed   By: Lowella Grip III M.D.   On: 06/08/2015 16:26   Ct Angio Chest Pe W/cm &/or Wo Cm  06/08/2015   CLINICAL DATA:  69 year old male bilateral lower extremity swelling. Shortness of breath.  EXAM: CT ANGIOGRAPHY CHEST WITH CONTRAST  TECHNIQUE: Multidetector CT imaging of the chest was performed using the standard protocol during bolus administration of intravenous contrast. Multiplanar CT image reconstructions and MIPs were obtained to evaluate the vascular anatomy.  CONTRAST:  42m OMNIPAQUE IOHEXOL 350 MG/ML SOLN  COMPARISON:  Radiograph dated 06/08/2015 and CT dated 08/01/2013  FINDINGS: There is diffuse increased interstitial prominence compatible with congestive changes . Right upper and lower lobe nodular and ground-glass airspace opacity likely represent superimposed pneumonia. Clinical correlation and follow-up recommended. There is no significant pleural effusion. No pneumothorax. The central airways are patent.  Mild atherosclerotic calcification of the thoracic aorta. All no CT evidence of pulmonary embolism. Top-normal cardiac size. No pericardial effusion. There is coronary vascular calcification. There are bilateral hilar and mediastinal adenopathy.  The esophagus and thyroid gland are grossly unremarkable. The chest wall soft tissues and visualized upper abdomen appear unremarkable. A 2.5 cm hypodense exophytic lesion from the anterior cortex of the left kidney is not well characterized but described as a cyst on CT dated 08/01/2013. There is degenerative changes of the spine. No acute fracture. Old healed right posterior rib fractures.  Review of the MIP images confirms the above findings.  IMPRESSION: No CT evidence of pulmonary embolism.  Diffuse interstitial prominence with areas of ground-glass and airspace opacity  involving the right lung likely representing a degree of congestion with superimposed pneumonia. Clinical correlation and follow-up recommended.   Electronically Signed   By: AAnner CreteM.D.   On: 06/08/2015 20:53    Microbiology: No results found for this or any previous visit (from the past 240 hour(s)).   Labs: Basic  Metabolic Panel:  Recent Labs Lab 06/08/15 1601 06/09/15 0640 06/10/15 0430  NA 139 142 143  K 3.7 3.8 3.5  CL 107 109 113*  CO2 22 23 23   GLUCOSE 70 82 135*  BUN 18 13 11   CREATININE 0.93 0.83 0.82  CALCIUM 9.0 8.8* 9.3  MG  --   --  2.2   Liver Function Tests:  Recent Labs Lab 06/09/15 0640  AST 20  ALT 15*  ALKPHOS 37*  BILITOT 0.3  PROT 6.1*  ALBUMIN 3.2*   No results for input(s): LIPASE, AMYLASE in the last 168 hours. No results for input(s): AMMONIA in the last 168 hours. CBC:  Recent Labs Lab 06/08/15 1601 06/09/15 0640 06/10/15 0430  WBC 5.3 5.6 6.2  NEUTROABS  --  3.2  --   HGB 9.7* 9.9* 10.0*  HCT 30.0* 30.3* 30.3*  MCV 90.1 91.3 90.4  PLT 371 349 373   Cardiac Enzymes: No results for input(s): CKTOTAL, CKMB, CKMBINDEX, TROPONINI in the last 168 hours. BNP: BNP (last 3 results)  Recent Labs  06/08/15 1601 06/10/15 0430  BNP 176.7* 173.7*    ProBNP (last 3 results) No results for input(s): PROBNP in the last 8760 hours.  CBG:  Recent Labs Lab 06/09/15 0627 06/09/15 1122 06/09/15 1623 06/09/15 2202 06/10/15 0642  GLUCAP 78 100* 89 99 120*       Signed:  Serine Kea MD, PhD  [facility] 06/10/2015, 9:09 AM

## 2015-06-10 NOTE — Progress Notes (Signed)
Pt ready for d/c per MD order after 2D echo results in. Discharge teaching given to pt, all questions answered, prescription for doxycycline called in to pharmacy. 2D echo performed and resulted EF 65-70%. Peripheral IVs removed, will be wheeled out by volunteer.   Lowella Dell 06/10/2015 2:06 PM

## 2015-06-11 LAB — FOLATE RBC
Folate, Hemolysate: 416.7 ng/mL
Folate, RBC: 1362 ng/mL (ref >498)
HEMATOCRIT: 30.6 % — AB (ref 37.5–51.0)

## 2015-06-14 LAB — CULTURE, BLOOD (ROUTINE X 2)
CULTURE: NO GROWTH
Culture: NO GROWTH

## 2015-07-11 ENCOUNTER — Other Ambulatory Visit: Payer: Self-pay | Admitting: Pulmonary Disease

## 2015-07-11 ENCOUNTER — Ambulatory Visit
Admission: RE | Admit: 2015-07-11 | Discharge: 2015-07-11 | Disposition: A | Discharge status: Home / Self Care | Payer: Medicare Other | Source: Ambulatory Visit | Attending: Pulmonary Disease | Admitting: Pulmonary Disease

## 2015-07-11 DIAGNOSIS — J189 Pneumonia, unspecified organism: Secondary | ICD-10-CM

## 2015-09-03 ENCOUNTER — Encounter: Payer: Self-pay | Admitting: Gastroenterology

## 2015-12-28 ENCOUNTER — Encounter (HOSPITAL_COMMUNITY): Payer: Self-pay | Admitting: Family Medicine

## 2015-12-28 ENCOUNTER — Emergency Department (HOSPITAL_COMMUNITY): Payer: Medicare Other

## 2015-12-28 ENCOUNTER — Observation Stay (HOSPITAL_COMMUNITY)
Admission: EM | Admit: 2015-12-28 | Discharge: 2015-12-29 | Discharge status: Against Medical Advice | Payer: Medicare Other | Attending: Orthopedic Surgery | Admitting: Orthopedic Surgery

## 2015-12-28 DIAGNOSIS — L02512 Cutaneous abscess of left hand: Secondary | ICD-10-CM | POA: Diagnosis present

## 2015-12-28 DIAGNOSIS — Z7984 Long term (current) use of oral hypoglycemic drugs: Secondary | ICD-10-CM | POA: Insufficient documentation

## 2015-12-28 DIAGNOSIS — I10 Essential (primary) hypertension: Secondary | ICD-10-CM | POA: Diagnosis not present

## 2015-12-28 DIAGNOSIS — Z8673 Personal history of transient ischemic attack (TIA), and cerebral infarction without residual deficits: Secondary | ICD-10-CM | POA: Insufficient documentation

## 2015-12-28 DIAGNOSIS — L02511 Cutaneous abscess of right hand: Secondary | ICD-10-CM | POA: Diagnosis present

## 2015-12-28 DIAGNOSIS — Z794 Long term (current) use of insulin: Secondary | ICD-10-CM

## 2015-12-28 DIAGNOSIS — L03012 Cellulitis of left finger: Secondary | ICD-10-CM | POA: Insufficient documentation

## 2015-12-28 DIAGNOSIS — E119 Type 2 diabetes mellitus without complications: Secondary | ICD-10-CM

## 2015-12-28 DIAGNOSIS — D72829 Elevated white blood cell count, unspecified: Secondary | ICD-10-CM | POA: Diagnosis present

## 2015-12-28 DIAGNOSIS — N400 Enlarged prostate without lower urinary tract symptoms: Secondary | ICD-10-CM | POA: Diagnosis not present

## 2015-12-28 DIAGNOSIS — K219 Gastro-esophageal reflux disease without esophagitis: Secondary | ICD-10-CM | POA: Insufficient documentation

## 2015-12-28 DIAGNOSIS — L0390 Cellulitis, unspecified: Secondary | ICD-10-CM | POA: Diagnosis not present

## 2015-12-28 DIAGNOSIS — E1140 Type 2 diabetes mellitus with diabetic neuropathy, unspecified: Secondary | ICD-10-CM | POA: Diagnosis not present

## 2015-12-28 DIAGNOSIS — D638 Anemia in other chronic diseases classified elsewhere: Secondary | ICD-10-CM | POA: Diagnosis not present

## 2015-12-28 DIAGNOSIS — E785 Hyperlipidemia, unspecified: Secondary | ICD-10-CM | POA: Diagnosis present

## 2015-12-28 DIAGNOSIS — F17200 Nicotine dependence, unspecified, uncomplicated: Secondary | ICD-10-CM | POA: Diagnosis not present

## 2015-12-28 DIAGNOSIS — I252 Old myocardial infarction: Secondary | ICD-10-CM | POA: Insufficient documentation

## 2015-12-28 DIAGNOSIS — J189 Pneumonia, unspecified organism: Secondary | ICD-10-CM | POA: Insufficient documentation

## 2015-12-28 DIAGNOSIS — L0291 Cutaneous abscess, unspecified: Secondary | ICD-10-CM | POA: Diagnosis present

## 2015-12-28 DIAGNOSIS — E118 Type 2 diabetes mellitus with unspecified complications: Secondary | ICD-10-CM

## 2015-12-28 HISTORY — DX: Anemia in other chronic diseases classified elsewhere: D63.8

## 2015-12-28 HISTORY — DX: Elevated white blood cell count, unspecified: D72.829

## 2015-12-28 HISTORY — DX: Type 2 diabetes mellitus with diabetic neuropathy, unspecified: E11.40

## 2015-12-28 HISTORY — DX: Hyperlipidemia, unspecified: E78.5

## 2015-12-28 LAB — COMPREHENSIVE METABOLIC PANEL
ALBUMIN: 3.8 g/dL (ref 3.5–5.0)
ALK PHOS: 41 U/L (ref 38–126)
ALT: 24 U/L (ref 17–63)
ANION GAP: 10 (ref 5–15)
AST: 27 U/L (ref 15–41)
BILIRUBIN TOTAL: 0.4 mg/dL (ref 0.3–1.2)
BUN: 18 mg/dL (ref 6–20)
CALCIUM: 9.5 mg/dL (ref 8.9–10.3)
CO2: 23 mmol/L (ref 22–32)
Chloride: 105 mmol/L (ref 101–111)
Creatinine, Ser: 0.94 mg/dL (ref 0.61–1.24)
GFR calc Af Amer: >60 mL/min (ref >60)
GFR calc non Af Amer: >60 mL/min (ref >60)
GLUCOSE: 109 mg/dL — AB (ref 65–99)
Potassium: 4 mmol/L (ref 3.5–5.1)
Sodium: 138 mmol/L (ref 135–145)
Total Protein: 6.9 g/dL (ref 6.5–8.1)

## 2015-12-28 LAB — CBC WITH DIFFERENTIAL/PLATELET
BASOS ABS: 0 10*3/uL (ref 0.0–0.1)
Basophils Relative: 0 %
EOS ABS: 0 10*3/uL (ref 0.0–0.7)
Eosinophils Relative: 0 %
HEMATOCRIT: 33.7 % — AB (ref 39.0–52.0)
HEMOGLOBIN: 10.6 g/dL — AB (ref 13.0–17.0)
Lymphocytes Relative: 12 %
Lymphs Abs: 1.5 10*3/uL (ref 0.7–4.0)
MCH: 28.9 pg (ref 26.0–34.0)
MCHC: 31.5 g/dL (ref 30.0–36.0)
MCV: 91.8 fL (ref 78.0–100.0)
Monocytes Absolute: 1 10*3/uL (ref 0.1–1.0)
Monocytes Relative: 8 %
NEUTROS ABS: 10.6 10*3/uL — AB (ref 1.7–7.7)
NEUTROS PCT: 80 %
Platelets: 269 10*3/uL (ref 150–400)
RBC: 3.67 MIL/uL — ABNORMAL LOW (ref 4.22–5.81)
RDW: 14.9 % (ref 11.5–15.5)
WBC: 13.2 10*3/uL — ABNORMAL HIGH (ref 4.0–10.5)

## 2015-12-28 LAB — GLUCOSE, CAPILLARY: GLUCOSE-CAPILLARY: 119 mg/dL — AB (ref 65–99)

## 2015-12-28 LAB — SEDIMENTATION RATE: Sed Rate: 53 mm/hr — ABNORMAL HIGH (ref 0–16)

## 2015-12-28 LAB — CBG MONITORING, ED: Glucose-Capillary: 96 mg/dL (ref 65–99)

## 2015-12-28 LAB — C-REACTIVE PROTEIN: CRP: 9.6 mg/dL — ABNORMAL HIGH (ref <1.0)

## 2015-12-28 MED ORDER — LIDOCAINE HCL 2 % IJ SOLN
20.0000 mL | Freq: Once | INTRAMUSCULAR | Status: DC
  Filled 2015-12-28: qty 20

## 2015-12-28 MED ORDER — OXYCODONE HCL 5 MG PO TABS
10.0000 mg | ORAL_TABLET | ORAL | Status: DC | PRN
  Administered 2015-12-28: 10 mg via ORAL
  Filled 2015-12-28: qty 2

## 2015-12-28 MED ORDER — MORPHINE SULFATE (PF) 2 MG/ML IV SOLN: 2.0000 mg | INTRAVENOUS | Status: DC | PRN

## 2015-12-28 MED ORDER — FAMOTIDINE 20 MG PO TABS: 20.0000 mg | ORAL_TABLET | Freq: Two times a day (BID) | ORAL | Status: DC | PRN

## 2015-12-28 MED ORDER — DIPHENHYDRAMINE HCL 25 MG PO CAPS: 25.0000 mg | ORAL_CAPSULE | Freq: Four times a day (QID) | ORAL | Status: DC | PRN

## 2015-12-28 MED ORDER — BUDESONIDE-FORMOTEROL FUMARATE 80-4.5 MCG/ACT IN AERO
2.0000 | INHALATION_SPRAY | Freq: Two times a day (BID) | RESPIRATORY_TRACT | Status: DC
  Administered 2015-12-28 – 2015-12-29 (×2): 2 via RESPIRATORY_TRACT
  Filled 2015-12-28: qty 6.9

## 2015-12-28 MED ORDER — IRBESARTAN 150 MG PO TABS
150.0000 mg | ORAL_TABLET | Freq: Every day | ORAL | Status: DC
  Administered 2015-12-29: 150 mg via ORAL
  Filled 2015-12-28: qty 1

## 2015-12-28 MED ORDER — DOCUSATE SODIUM 100 MG PO CAPS
100.0000 mg | ORAL_CAPSULE | Freq: Two times a day (BID) | ORAL | Status: DC
  Administered 2015-12-28 – 2015-12-29 (×2): 100 mg via ORAL
  Filled 2015-12-28 (×2): qty 1

## 2015-12-28 MED ORDER — METHOCARBAMOL 1000 MG/10ML IJ SOLN
500.0000 mg | Freq: Four times a day (QID) | INTRAVENOUS | Status: DC | PRN
  Filled 2015-12-28: qty 5

## 2015-12-28 MED ORDER — SODIUM CHLORIDE 0.9 % IV SOLN
3.0000 g | Freq: Four times a day (QID) | INTRAVENOUS | Status: DC
  Administered 2015-12-28 – 2015-12-29 (×4): 3 g via INTRAVENOUS
  Filled 2015-12-28 (×7): qty 3

## 2015-12-28 MED ORDER — ONDANSETRON HCL 4 MG PO TABS: 4.0000 mg | ORAL_TABLET | Freq: Four times a day (QID) | ORAL | Status: DC | PRN

## 2015-12-28 MED ORDER — VANCOMYCIN HCL 10 G IV SOLR
1250.0000 mg | Freq: Two times a day (BID) | INTRAVENOUS | Status: DC
  Administered 2015-12-28 – 2015-12-29 (×2): 1250 mg via INTRAVENOUS
  Filled 2015-12-28 (×3): qty 1250

## 2015-12-28 MED ORDER — POTASSIUM CHLORIDE IN NACL 20-0.45 MEQ/L-% IV SOLN
INTRAVENOUS | Status: DC
  Filled 2015-12-28 (×2): qty 1000

## 2015-12-28 MED ORDER — MAGNESIUM CITRATE PO SOLN: 1.0000 | Freq: Once | ORAL | Status: DC | PRN

## 2015-12-28 MED ORDER — BISACODYL 10 MG RE SUPP: 10.0000 mg | Freq: Every day | RECTAL | Status: DC | PRN

## 2015-12-28 MED ORDER — INSULIN ASPART PROT & ASPART (70-30 MIX) 100 UNIT/ML ~~LOC~~ SUSP
20.0000 [IU] | Freq: Two times a day (BID) | SUBCUTANEOUS | Status: DC
  Administered 2015-12-29: 20 [IU] via SUBCUTANEOUS
  Filled 2015-12-28 (×2): qty 10

## 2015-12-28 MED ORDER — PROMETHAZINE HCL 25 MG RE SUPP: 12.5000 mg | Freq: Four times a day (QID) | RECTAL | Status: DC | PRN

## 2015-12-28 MED ORDER — VALSARTAN-HYDROCHLOROTHIAZIDE 160-25 MG PO TABS: 1.0000 | ORAL_TABLET | Freq: Every day | ORAL | Status: DC

## 2015-12-28 MED ORDER — SIMVASTATIN 5 MG PO TABS
10.0000 mg | ORAL_TABLET | Freq: Every day | ORAL | Status: DC
  Administered 2015-12-28: 10 mg via ORAL
  Filled 2015-12-28: qty 2

## 2015-12-28 MED ORDER — VITAMIN C 500 MG PO TABS
1000.0000 mg | ORAL_TABLET | Freq: Every day | ORAL | Status: DC
  Administered 2015-12-29: 1000 mg via ORAL
  Filled 2015-12-28: qty 2

## 2015-12-28 MED ORDER — LACTATED RINGERS IV SOLN: INTRAVENOUS | Status: DC

## 2015-12-28 MED ORDER — METHOCARBAMOL 500 MG PO TABS: 500.0000 mg | ORAL_TABLET | Freq: Four times a day (QID) | ORAL | Status: DC | PRN

## 2015-12-28 MED ORDER — HYDROCHLOROTHIAZIDE 25 MG PO TABS
25.0000 mg | ORAL_TABLET | Freq: Every day | ORAL | Status: DC
  Administered 2015-12-29: 25 mg via ORAL
  Filled 2015-12-28: qty 1

## 2015-12-28 MED ORDER — TAMSULOSIN HCL 0.4 MG PO CAPS
0.4000 mg | ORAL_CAPSULE | Freq: Every day | ORAL | Status: DC
  Administered 2015-12-29: 0.4 mg via ORAL
  Filled 2015-12-28: qty 1

## 2015-12-28 MED ORDER — ONDANSETRON HCL 4 MG/2ML IJ SOLN: 4.0000 mg | Freq: Four times a day (QID) | INTRAMUSCULAR | Status: DC | PRN

## 2015-12-28 MED ORDER — ALPRAZOLAM 0.5 MG PO TABS
0.5000 mg | ORAL_TABLET | Freq: Two times a day (BID) | ORAL | Status: DC
  Administered 2015-12-28 – 2015-12-29 (×2): 0.5 mg via ORAL
  Filled 2015-12-28 (×2): qty 1

## 2015-12-28 NOTE — ED Notes (Signed)
MD at bedside. 

## 2015-12-28 NOTE — H&P (Addendum)
Gregory Walker is an 70 y.o. male.   Chief Complaint: Left index finger abscess with ascending cellulitis HPI: Patient presents with a left index finger infection 5 days. He states he was cleaning out his yard and progressively noted swelling tenderness and erythema about the finger. He notes no other complaints today.  He states he is diabetic. He occasionally uses insulin when he feels the need based upon his blood sugars. He is a little vague about this. I reviewed this with him at length. He is anxious to go home but I've informed him that unfortunately he has a high white count and a significant infection.  He does not note any history of traumatic event other than the yardwork last week. He does not recall a stick burn or puncture wound.  I reviewed these issues with her at length and the findings.  Patient presents for evaluation and treatment of the of their upper extremity predicament. The patient denies neck, back, chest or  abdominal pain. The patient notes that they have no lower extremity problems. The patients primary complaint is noted. We are planning surgical care pathway for the upper extremity.  Past Medical History  Diagnosis Date  . Hypertension   . Diabetes mellitus   . Acute MI ([place])   . Stroke [facility])     Past Surgical History  Procedure Laterality Date  . Eye surgery      History reviewed. No pertinent family history. Social History:  reports that he has been smoking.  He does not have any smokeless tobacco history on file. He reports that he does not drink alcohol or use illicit drugs.  Allergies:  Allergies  Allergen Reactions  . Codeine Itching     (Not in a hospital admission)  Results for orders placed or performed during the hospital encounter of 12/28/15 (from the past 48 hour(s))  CBC with Differential     Status: Abnormal   Collection Time: 12/28/15 11:42 AM  Result Value Ref Range   WBC 13.2 (H) 4.0 - 10.5 K/uL   RBC 3.67 (L) 4.22 - 5.81  MIL/uL   Hemoglobin 10.6 (L) 13.0 - 17.0 g/dL   HCT 33.7 (L) 39.0 - 52.0 %   MCV 91.8 78.0 - 100.0 fL   MCH 28.9 26.0 - 34.0 pg   MCHC 31.5 30.0 - 36.0 g/dL   RDW 14.9 11.5 - 15.5 %   Platelets 269 150 - 400 K/uL   Neutrophils Relative % 80 %   Neutro Abs 10.6 (H) 1.7 - 7.7 K/uL   Lymphocytes Relative 12 %   Lymphs Abs 1.5 0.7 - 4.0 K/uL   Monocytes Relative 8 %   Monocytes Absolute 1.0 0.1 - 1.0 K/uL   Eosinophils Relative 0 %   Eosinophils Absolute 0.0 0.0 - 0.7 K/uL   Basophils Relative 0 %   Basophils Absolute 0.0 0.0 - 0.1 K/uL  Comprehensive metabolic panel     Status: Abnormal   Collection Time: 12/28/15 11:42 AM  Result Value Ref Range   Sodium 138 135 - 145 mmol/L   Potassium 4.0 3.5 - 5.1 mmol/L   Chloride 105 101 - 111 mmol/L   CO2 23 22 - 32 mmol/L   Glucose, Bld 109 (H) 65 - 99 mg/dL   BUN 18 6 - 20 mg/dL   Creatinine, Ser 0.94 0.61 - 1.24 mg/dL   Calcium 9.5 8.9 - 10.3 mg/dL   Total Protein 6.9 6.5 - 8.1 g/dL   Albumin 3.8 3.5 - 5.0  g/dL   AST 27 15 - 41 U/L   ALT 24 17 - 63 U/L   Alkaline Phosphatase 41 38 - 126 U/L   Total Bilirubin 0.4 0.3 - 1.2 mg/dL   GFR calc non Af Amer >60 >60 mL/min   GFR calc Af Amer >60 >60 mL/min    Comment: (NOTE) The eGFR has been calculated using the CKD EPI equation. This calculation has not been validated in all clinical situations. eGFR's persistently <60 mL/min signify possible Chronic Kidney Disease.    Anion gap 10 5 - 15  C-reactive protein     Status: Abnormal   Collection Time: 12/28/15  2:39 PM  Result Value Ref Range   CRP 9.6 (H) <1.0 mg/dL   Dg Finger Index Left  12/28/2015  CLINICAL DATA:  Swelling of proximal left second finger. EXAM: LEFT INDEX FINGER 2+V COMPARISON:  None. FINDINGS: Soft tissue swelling is identified of the proximal digit without evidence of fracture, soft tissue foreign body or soft tissue gas. No bony lesions are seen. Very mild proliferative changes are seen involving the MCP and  interphalangeal joints. IMPRESSION: Soft tissue swelling without evidence of fracture or foreign body. Electronically Signed   By: Aletta Edouard M.D.   On: 12/28/2015 13:52    Review of Systems  Constitutional: Negative.   Eyes: Negative.   Respiratory: Negative.   Gastrointestinal: Negative.   Genitourinary: Negative.   Psychiatric/Behavioral: Negative.     Blood pressure 147/73, pulse 77, temperature 98.4 F (36.9 C), temperature source Oral, resp. rate 16, SpO2 98 %. Physical Exam  Right index finger erythema cellulitis with associated mass. This is an obvious abscess is a small area about his hair follicle with the draining sinus.  He does have intact flexion. There is no signs of dystrophic reaction.  His x-rays are negative.  The patient is alert and oriented in no acute distress. The patient complains of pain in the affected upper extremity.  The patient is noted to have a normal HEENT exam. Lung fields show equal chest expansion and no shortness of breath. Abdomen exam is nontender without distention. Lower extremity examination does not show any fracture dislocation or blood clot symptoms. Pelvis is stable and the neck and back are stable and nontender. Assessment/Plan Infection left index finger with deep abscess.  I've consented him for irrigation debridement and repair is necessary.   We are planning surgery for your upper extremity. The risk and benefits of surgery to include risk of bleeding, infection, anesthesia,  damage to normal structures and failure of the surgery to accomplish its intended goals of relieving symptoms and restoring function have been discussed in detail. With this in mind we plan to proceed. I have specifically discussed with the patient the pre-and postoperative regime and the dos and don'ts and risk and benefits in great detail. Risk and benefits of surgery also include risk of dystrophy(CRPS), chronic nerve pain, failure of the healing  process to go onto completion and other inherent risks of surgery The relavent the pathophysiology of the disease/injury process, as well as the alternatives for treatment and postoperative course of action has been discussed in great detail with the patient who desires to proceed.  We will do everything in our power to help you (the patient) restore function to the upper extremity. It is a pleasure to see this patient today.  Procedure note: Patient was given a block anesthetic with 2% lidocaine without epinephrine. Prior to this I scrubbed him with Hibiclens  and Betadine. Once he was anesthetized about the finger I then performed 2 different surgical Betadine scrubs. He tolerated this well. Following this outlined marks were made and a timeout was observed. Once this was complete I then performed a incision about the radial aspect of his index finger. He had an obvious deep abscess. Cultures were taken for a rope and anaerobic culture. I undermined the fat necrosis and removed all infected material. He had a high degree of necrotic focus.  I performed a extensor tendon tenosynovectomy and removed the inflammatory peal off of the paratenon. Following this we irrigated with 2 L of saline without difficulty.  He tolerated the procedure well. He was dressed with a wick dressing followed by Neosporin Adaptic and a compressive wrap.  I discussed with the patient my findings and the fact that I would recommend admission, IV antibiotics, hospitalist consult for his diabetic needs and close observation.  He is anxious to go home but consents sustaining the night.  We discussed all issues at length.  Once again given his intraoperative findings and his white count as well as his diabetic status I would recommend admission until we get his infection under control. I do feel that he'll need to process plate and outpatient wound care. All questions have been encouraged and answered and addressed.   Patient  Active Problem List   Diagnosis Date Noted  . CAP (community acquired pneumonia) 06/08/2015  . Diabetes ([place]) 06/08/2015  . HTN (hypertension) 06/08/2015  . Pneumonia 06/08/2015   Paulene Floor, MD 12/28/2015, 3:38 PM

## 2015-12-28 NOTE — ED Provider Notes (Signed)
CSN: [id number]     Arrival date & time 12/28/15  1110 History   First MD Initiated Contact with Patient 12/28/15 1242     Chief Complaint  Patient presents with  . Hand Pain     (Consider location/radiation/quality/duration/timing/severity/associated sxs/prior Treatment) HPI Patient presents to the emergency department with swelling to the left index finger since Tuesday.  The patient states that he got worse since that time.  Patient states that he did not have any wounds or injuries to the finger.  Patient states he was cleaning up some trash on Tuesday and noticed that swelling developed later on that day.  Patient states that he has not have any fever, nausea, vomiting, weakness, dizziness, headache, blurred vision, numbness, or syncope.  Patient states that he did not take any medications prior to arrival for her symptoms Past Medical History  Diagnosis Date  . Hypertension   . Diabetes mellitus   . Acute MI ([place])   . Stroke [facility])    Past Surgical History  Procedure Laterality Date  . Eye surgery     History reviewed. No pertinent family history. Social History  Substance Use Topics  . Smoking status: Current Some Day Smoker  . Smokeless tobacco: None  . Alcohol Use: No    Review of Systems  All other systems negative except as documented in the HPI. All pertinent positives and negatives as reviewed in the HPI.  Allergies  Codeine  Home Medications   Prior to Admission medications   Medication Sig Start Date End Date Taking? Authorizing Provider  ALPRAZolam Duanne Moron) 0.5 MG tablet Take 0.5 mg by mouth 2 (two) times daily. 05/15/15  Yes Historical Provider, MD  budesonide-formoterol (SYMBICORT) 80-4.5 MCG/ACT inhaler Inhale 2 puffs into the lungs 2 (two) times daily.   Yes Historical Provider, MD  Choline Fenofibrate (FENOFIBRIC ACID) 135 MG CPDR Take 135 mg by mouth daily.  05/30/15  Yes Historical Provider, MD  cilostazol (PLETAL) 50 MG tablet Take 100 mg by mouth 2  (two) times daily.  06/06/15  Yes Historical Provider, MD  ezetimibe (ZETIA) 10 MG tablet Take 10 mg by mouth daily.   Yes Historical Provider, MD  fluticasone (FLONASE) 50 MCG/ACT nasal spray Place 2 sprays into both nostrils 2 (two) times daily.  05/30/15  Yes Historical Provider, MD  gabapentin (NEURONTIN) 300 MG capsule Take 600 mg by mouth 2 (two) times daily.  05/30/15  Yes Historical Provider, MD  HUMALOG MIX 75/25 (75-25) 100 UNIT/ML SUSP injection Inject 30 Units into the skin 2 (two) times daily as needed (CBG >124).  03/25/15  Yes Historical Provider, MD  meloxicam (MOBIC) 7.5 MG tablet Take 7.5 mg by mouth 2 (two) times daily. 05/14/15  Yes Historical Provider, MD  oxyCODONE-acetaminophen (PERCOCET) 10-325 MG per tablet Take 1 tablet by mouth every 6 (six) hours as needed for pain.  05/31/15  Yes Historical Provider, MD  pioglitazone-metformin (ACTOPLUS MET) 15-850 MG per tablet Take 1 tablet by mouth at bedtime.  05/14/15  Yes Historical Provider, MD  saxagliptin HCl (ONGLYZA) 5 MG TABS tablet Take 5 mg by mouth daily.   Yes Historical Provider, MD  simvastatin (ZOCOR) 10 MG tablet Take 10 mg by mouth at bedtime.  05/27/15  Yes Historical Provider, MD  tamsulosin (FLOMAX) 0.4 MG CAPS capsule Take 0.4 mg by mouth daily.  06/06/15  Yes Historical Provider, MD  tiZANidine (ZANAFLEX) 2 MG tablet Take 2 mg by mouth 2 (two) times daily.  05/29/15  Yes Historical Provider,  MD  traMADol (ULTRAM) 50 MG tablet Take 50 mg by mouth 2 (two) times daily. scheduled 05/31/15  Yes Historical Provider, MD  valsartan-hydrochlorothiazide (DIOVAN-HCT) 160-25 MG per tablet Take 1 tablet by mouth daily.  05/27/15  Yes Historical Provider, MD   BP 129/68 mmHg  Pulse 73  Temp(Src) 98.4 F (36.9 C) (Oral)  Resp 16  SpO2 96% Physical Exam  Constitutional: He is oriented to person, place, and time. He appears well-developed and well-nourished. No distress.  Pulmonary/Chest: Effort normal.  Musculoskeletal:        Hands: Neurological: He is alert and oriented to person, place, and time. He exhibits normal muscle tone. Coordination normal.  Skin: Skin is warm and dry. No rash noted. No erythema.    ED Course  Procedures (including critical care time) Labs Review Labs Reviewed  CBC WITH DIFFERENTIAL/PLATELET - Abnormal; Notable for the following:    WBC 13.2 (*)    RBC 3.67 (*)    Hemoglobin 10.6 (*)    HCT 33.7 (*)    Neutro Abs 10.6 (*)    All other components within normal limits  COMPREHENSIVE METABOLIC PANEL - Abnormal; Notable for the following:    Glucose, Bld 109 (*)    All other components within normal limits    Imaging Review Dg Finger Index Left  12/28/2015  CLINICAL DATA:  Swelling of proximal left second finger. EXAM: LEFT INDEX FINGER 2+V COMPARISON:  None. FINDINGS: Soft tissue swelling is identified of the proximal digit without evidence of fracture, soft tissue foreign body or soft tissue gas. No bony lesions are seen. Very mild proliferative changes are seen involving the MCP and interphalangeal joints. IMPRESSION: Soft tissue swelling without evidence of fracture or foreign body. Electronically Signed   By: Aletta Edouard M.D.   On: 12/28/2015 13:52   I have personally reviewed and evaluated these images and lab results as part of my medical decision-making.   EKG Interpretation None      I spoke with Dr. Amedeo Plenty of hand surgery, who will be in to evaluate the patient.  Patient is otherwise stable.   Dalia Heading, PA-C 12/28/15 1619  Gareth Morgan, MD 12/30/15 [phone]

## 2015-12-28 NOTE — Consult Note (Signed)
[facility] Medical Consultation  Gregory Walker FBP:[id number] DOB: 04-02-1946 DOA: 12/28/2015 PCP: Leola Brazil, MD   Requesting physician: Dr. Saintclair Halsted Date of consultation: 12/28/15 Reason for consult: Management of chronic medical diseases Impression/Recommendations Active Problems:   Diabetes ([place])   HTN (hypertension)   Cellulitis and abscess   Hyperlipidemia   Leukocytosis   Anemia of chronic disease   Abscess of right hand including fingers   Left index cellulitis with abscess Status post left index debridement. He was placed on IV antibiotics, and this is being managed by orthopedics.  Diabetes mellitus type 2. Insulin-dependent Current glucose levels are 109. Patient uses insulin at home and blood sugar running high according to him. Patient will be placed and an insulin sliding scale here and would return to his oral agents once discharged from hospital. His last A1c was on 06/09/2015 at 5.6. Will recheck during this admission.  Hypertension  Current BP 132/72 mmHg  Pulse 76 Continue with his home medications including Diovan  Hyperlipidemia:  we will continue with his home medications including Zetia and Zocor  Diabetic neuropathy  will continue with Neurontin   Leukocytosis this is likely reactive, current count at 13.2, with ANC of 10.6.The patient has been placed on IV antibiotics and IV fluids. He is afebrile. We will continue to monitor  Anemia of chronic disease and infection. Appears to be at baseline, which is between 9 and 10. Currently hemoglobin is 10.6. No bleeding issues are noted. We will continue to monitor.  No transfusion is indicated at this time.    HPI: Gregory Walker is a 70 year old man with a history of diabetes,Insulin-dependent, hypertension, prior history of MI, and history of CVA, admitted today with a 5 day history of left index finger infection after cleaning his yard, which required incision and debridement, as he was  developing ascending abscess. After the successful procedure, he continued to be on antibiotics and he is going to be admitted for observation by the surgical team.He denies any shortness of breath, chest pain, abdominal pain, or swelling. He is a favorable, vital signs are stable. Chest x-ray is negative. X-rays on the left index finger are negative for osteomyelitis. Labs are remarkable for elevated white count at 13.2, With ANC at 10.6, and hemoglobin 10.6. Platelets are normal. No bleeding issues are noted.   Review of Systems:  Review of Systems  Constitutional: Negative.   HENT: Negative.   Eyes: Negative.   Respiratory: Negative.   Cardiovascular: Negative.   Gastrointestinal: Negative.   Genitourinary: Negative.   Musculoskeletal:       Otherwise neg  Skin: Negative for itching and rash.       Left index finger abscess has been drained.  Neurological: Negative.   Endo/Heme/Allergies: Negative.   Psychiatric/Behavioral: Negative.     Past Medical History  Diagnosis Date  . Hypertension   . Diabetes mellitus   . Acute MI ([place])   . Stroke ([place])   . Hyperlipidemia   . Leukocytosis   . Anemia of chronic disease   . Diabetic neuropathy [facility])    Past Surgical History  Procedure Laterality Date  . Eye surgery     Social History:  reports that he has been smoking.  He does not have any smokeless tobacco history on file. He reports that he does not drink alcohol or use illicit drugs.  Allergies  Allergen Reactions  . Codeine Itching   History reviewed. No pertinent family history.  Prior to Admission medications  Medication Sig Start Date End Date Taking? Authorizing Provider  ALPRAZolam Duanne Moron) 0.5 MG tablet Take 0.5 mg by mouth 2 (two) times daily. 05/15/15  Yes Historical Provider, MD  budesonide-formoterol (SYMBICORT) 80-4.5 MCG/ACT inhaler Inhale 2 puffs into the lungs 2 (two) times daily.   Yes Historical Provider, MD  Choline Fenofibrate (FENOFIBRIC ACID) 135 MG  CPDR Take 135 mg by mouth daily.  05/30/15  Yes Historical Provider, MD  cilostazol (PLETAL) 50 MG tablet Take 100 mg by mouth 2 (two) times daily.  06/06/15  Yes Historical Provider, MD  ezetimibe (ZETIA) 10 MG tablet Take 10 mg by mouth daily.   Yes Historical Provider, MD  fluticasone (FLONASE) 50 MCG/ACT nasal spray Place 2 sprays into both nostrils 2 (two) times daily.  05/30/15  Yes Historical Provider, MD  gabapentin (NEURONTIN) 300 MG capsule Take 600 mg by mouth 2 (two) times daily.  05/30/15  Yes Historical Provider, MD  HUMALOG MIX 75/25 (75-25) 100 UNIT/ML SUSP injection Inject 30 Units into the skin 2 (two) times daily as needed (CBG >124).  03/25/15  Yes Historical Provider, MD  meloxicam (MOBIC) 7.5 MG tablet Take 7.5 mg by mouth 2 (two) times daily. 05/14/15  Yes Historical Provider, MD  oxyCODONE-acetaminophen (PERCOCET) 10-325 MG per tablet Take 1 tablet by mouth every 6 (six) hours as needed for pain.  05/31/15  Yes Historical Provider, MD  pioglitazone-metformin (ACTOPLUS MET) 15-850 MG per tablet Take 1 tablet by mouth at bedtime.  05/14/15  Yes Historical Provider, MD  saxagliptin HCl (ONGLYZA) 5 MG TABS tablet Take 5 mg by mouth daily.   Yes Historical Provider, MD  simvastatin (ZOCOR) 10 MG tablet Take 10 mg by mouth at bedtime.  05/27/15  Yes Historical Provider, MD  tamsulosin (FLOMAX) 0.4 MG CAPS capsule Take 0.4 mg by mouth daily.  06/06/15  Yes Historical Provider, MD  tiZANidine (ZANAFLEX) 2 MG tablet Take 2 mg by mouth 2 (two) times daily.  05/29/15  Yes Historical Provider, MD  traMADol (ULTRAM) 50 MG tablet Take 50 mg by mouth 2 (two) times daily. scheduled 05/31/15  Yes Historical Provider, MD  valsartan-hydrochlorothiazide (DIOVAN-HCT) 160-25 MG per tablet Take 1 tablet by mouth daily.  05/27/15  Yes Historical Provider, MD   Physical Exam: Blood pressure 132/72, pulse 76, temperature 98.4 F (36.9 C), temperature source Oral, resp. rate 16, height 5' 6"  (1.676 m), weight 95.3  kg (210 lb 1.6 oz), SpO2 99 %. Filed Vitals:   12/28/15 1530 12/28/15 1545  BP: 147/73 132/72  Pulse: 77 76  Temp:    Resp:     Physical Exam  Constitutional: He appears well-developed and well-nourished. No distress.  HENT:  Head: Normocephalic.  Mouth/Throat: No oropharyngeal exudate.  asymmetrical   Eyes: Pupils are equal, round, and reactive to light. Left eye exhibits no discharge. No scleral icterus.  Neck: Normal range of motion. Neck supple. No JVD present. No tracheal deviation present. No thyromegaly present.  Cardiovascular: Normal rate and regular rhythm.  Exam reveals no gallop and no friction rub.   Murmur heard. Pulmonary/Chest: Breath sounds normal. He is in respiratory distress. He has no wheezes. He has no rales. He exhibits no tenderness.  Abdominal: Bowel sounds are normal. He exhibits no distension and no mass. There is no tenderness. There is no rebound and no guarding. No hernia.  protruberant  Musculoskeletal: Normal range of motion. He exhibits no edema or tenderness.  Left hand bandaged  Lymphadenopathy:    He has no cervical  adenopathy.  Neurological: He displays normal reflexes. No cranial nerve deficit. He exhibits normal muscle tone. Coordination normal.  Skin: Skin is warm and dry. No rash noted. He is not diaphoretic. No erythema. No pallor.  Psychiatric: He has a normal mood and affect. His behavior is normal. Judgment and thought content normal.   Labs on Admission:  Basic Metabolic Panel:  Recent Labs Lab 12/28/15 1142  NA 138  K 4.0  CL 105  CO2 23  GLUCOSE 109*  BUN 18  CREATININE 0.94  CALCIUM 9.5   Liver Function Tests:  Recent Labs Lab 12/28/15 1142  AST 27  ALT 24  ALKPHOS 41  BILITOT 0.4  PROT 6.9  ALBUMIN 3.8   CBC:  Recent Labs Lab 12/28/15 1142  WBC 13.2*  NEUTROABS 10.6*  HGB 10.6*  HCT 33.7*  MCV 91.8  PLT 269    Radiological Exams on Admission: Dg Finger Index Left  12/28/2015  CLINICAL DATA:   Swelling of proximal left second finger. EXAM: LEFT INDEX FINGER 2+V COMPARISON:  None. FINDINGS: Soft tissue swelling is identified of the proximal digit without evidence of fracture, soft tissue foreign body or soft tissue gas. No bony lesions are seen. Very mild proliferative changes are seen involving the MCP and interphalangeal joints. IMPRESSION: Soft tissue swelling without evidence of fracture or foreign body. Electronically Signed   By: Aletta Edouard M.D.   On: 12/28/2015 13:52       [facility]   If 7PM-7AM, please contact night-coverage [email or web address] Password [facility] 12/28/2015, 5:26 PM

## 2015-12-28 NOTE — Progress Notes (Signed)
Pharmacy Antibiotic Note  Gregory Walker is a 70 y.o. male admitted on 12/28/2015 with cellulitis.  Pharmacy has been consulted for vancomycin and Unasyn dosing. He is s/p left index finger debridement, being managed by ortho.  Plan: Vancomycin 1250 IV every 12 hours.  Goal trough 10-15 mcg/mL.  Unasyn 3g IV q6h     Temp (24hrs), Avg:98.4 F (36.9 C), Min:98.4 F (36.9 C), Max:98.4 F (36.9 C)   Recent Labs Lab 12/28/15 1142  WBC 13.2*  CREATININE 0.94    CrCl cannot be calculated (Unknown ideal weight.).    Allergies  Allergen Reactions  . Codeine Itching    Antimicrobials this admission: vanc 2/11 >>  Unasyn 2/11 >>   Dose adjustments this admission: N/A  Microbiology results: 2/11 wound: sent  Thank you for allowing pharmacy to be a part of this patient's care.  Griselda Tosh D. Caran Storck, PharmD, BCPS Clinical Pharmacist Pager: [phone] 12/28/2015 5:06 PM

## 2015-12-28 NOTE — ED Notes (Signed)
Pt here for sig swelling to index finger. swelling into hand and sts pain all the way into upper arm.

## 2015-12-29 DIAGNOSIS — L02512 Cutaneous abscess of left hand: Secondary | ICD-10-CM | POA: Diagnosis not present

## 2015-12-29 DIAGNOSIS — E0811 Diabetes mellitus due to underlying condition with ketoacidosis with coma: Secondary | ICD-10-CM | POA: Diagnosis not present

## 2015-12-29 DIAGNOSIS — I10 Essential (primary) hypertension: Secondary | ICD-10-CM | POA: Diagnosis not present

## 2015-12-29 DIAGNOSIS — L0390 Cellulitis, unspecified: Secondary | ICD-10-CM | POA: Diagnosis not present

## 2015-12-29 DIAGNOSIS — L0291 Cutaneous abscess, unspecified: Secondary | ICD-10-CM

## 2015-12-29 DIAGNOSIS — E785 Hyperlipidemia, unspecified: Secondary | ICD-10-CM | POA: Diagnosis not present

## 2015-12-29 LAB — BASIC METABOLIC PANEL
Anion gap: 11 (ref 5–15)
BUN: 13 mg/dL (ref 6–20)
CHLORIDE: 108 mmol/L (ref 101–111)
CO2: 23 mmol/L (ref 22–32)
Calcium: 9.4 mg/dL (ref 8.9–10.3)
Creatinine, Ser: 0.74 mg/dL (ref 0.61–1.24)
GFR calc non Af Amer: >60 mL/min (ref >60)
GLUCOSE: 94 mg/dL (ref 65–99)
POTASSIUM: 3.8 mmol/L (ref 3.5–5.1)
SODIUM: 142 mmol/L (ref 135–145)

## 2015-12-29 LAB — CBC
HEMATOCRIT: 33.4 % — AB (ref 39.0–52.0)
HEMOGLOBIN: 10.6 g/dL — AB (ref 13.0–17.0)
MCH: 29 pg (ref 26.0–34.0)
MCHC: 31.7 g/dL (ref 30.0–36.0)
MCV: 91.3 fL (ref 78.0–100.0)
Platelets: 253 10*3/uL (ref 150–400)
RBC: 3.66 MIL/uL — AB (ref 4.22–5.81)
RDW: 15.3 % (ref 11.5–15.5)
WBC: 11.3 10*3/uL — ABNORMAL HIGH (ref 4.0–10.5)

## 2015-12-29 LAB — GLUCOSE, CAPILLARY: GLUCOSE-CAPILLARY: 113 mg/dL — AB (ref 65–99)

## 2015-12-29 MED ORDER — INSULIN ASPART 100 UNIT/ML ~~LOC~~ SOLN: 0.0000 [IU] | Freq: Three times a day (TID) | SUBCUTANEOUS | Status: DC

## 2015-12-29 MED ORDER — LACTATED RINGERS IV SOLN: INTRAVENOUS | Status: DC

## 2015-12-29 MED ORDER — HEPARIN SODIUM (PORCINE) 5000 UNIT/ML IJ SOLN: 5000.0000 [IU] | Freq: Three times a day (TID) | INTRAMUSCULAR | Status: DC

## 2015-12-29 NOTE — Discharge Summary (Signed)
NAMEDENALI, BECVAR            ACCOUNT NO.:  [id number]  MEDICAL RECORD NO.:  [id number]  LOCATION:  5M14C                        FACILITY:  [facility]  PHYSICIAN:  Dionne Ano. Tashyra Adduci, M.D.DATE OF BIRTH:  January 07, 1946  DATE OF ADMISSION:  12/28/2015 DATE OF DISCHARGE:  12/29/2015                              DISCHARGE SUMMARY   Gregory Walker was admitted on December 28, 2015.  He underwent irrigation and debridement of his left index finger abscess with tenosynovectomy, this was rather extensive.  He was admitted for Medicine consult, IV antibiotics, and we were waiting cultures.  He did not want to stay and signed out against medical advice.  Following signing out AMA, I called him at his home and talked to his significant other, [name].  [name] informed me that he likes to do his own thing.  I discussed with [name] and necessity of treating his infection. I urged them to seek care.  In addition to this, I did call his pharmacy and phoned him Bactrim DS 1 p.o. b.i.d. x14 days and instructed [name] on how to take care of his dressings.  I also spoke with the pharmacist who knows the patient and we will reiterate the need and necessities.  Hopefully, he will engage his care in a different direction.  All questions of course have been addressed.  I feel it is very unfortunate that he signed out AMA and I do feel that this will impact his recovery.     Dionne Ano. [name], M.D.     [facility]  D:  12/29/2015  T:  12/29/2015  Job:  [id number]

## 2015-12-29 NOTE — Progress Notes (Signed)
Patient left AMA. Refused to stay despite receiving information on the risks of leaving prior to completion of treatment. 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 Educated on the signs and symptoms of infection and advised to return to ER if complications occurred. Dr. Thedore Mins and Dr. [name] staff notified of AMA status.

## 2015-12-29 NOTE — Progress Notes (Signed)
Consult note                                   Patient Demographics:    Gregory Walker, is a 70 y.o. male, DOB - 1946/05/20, ZOX:[id number]  Admit date - 12/28/2015   Admitting Physician Dominica Severin, MD  Outpatient Primary MD for the patient is Eino Farber, MD  LOS -    Chief Complaint  Patient presents with  . Hand Pain        Subjective:    Gregory Walker today has, No headache, No chest pain, No abdominal pain - No Nausea, No new weakness tingling or numbness, No Cough - SOB.     Assessment  & Plan :     1. Left index finger cellulitis and abscess. Defer to primary team which is Dr. [name] hand surgeon, status post incision and drainage on 12/28/2015, currently on IV antibiotics, patient threatening to leave AMA. Extensively counseled by me that doing this can result in loss of limb or even death. Patient is alert awake oriented 3 and competent. Requested nurse to inform the primary team.  2. Insulin-dependent type 2 diabetes mellitus. Currently on insulin aspart 20 twice a day along with sliding scale.  Lab Results  Component Value Date   HGBA1C 5.6 06/09/2015    CBG (last 3)   Recent Labs  12/28/15 1724 12/28/15 2228 12/29/15 0650  GLUCAP 96 119* 113*    3. BPH. An alpha-blocker continue.  4. Essential hypertension. Currently on combination of ARB, HCTZ which will be continued. Blood pressure stable.  5. Dyslipidemia. On home dose statin.  6. GERD. On PPI.  7. Anemia of chronic disease. Stable. Outpatient follow-up with PCP.     DVT Prophylaxis  :  Heparin    Lab Results  Component Value Date   PLT 253 12/29/2015    Inpatient Medications  Scheduled Meds: . ALPRAZolam  0.5 mg Oral BID  . ampicillin-sulbactam (UNASYN) IV  3 g Intravenous Q6H  . budesonide-formoterol  2 puff  Inhalation BID  . docusate sodium  100 mg Oral BID  . irbesartan  150 mg Oral Daily   And  . hydrochlorothiazide  25 mg Oral Daily  . insulin aspart protamine- aspart  20 Units Subcutaneous BID WC  . simvastatin  10 mg Oral QHS  . tamsulosin  0.4 mg Oral Daily  . vancomycin  1,250 mg Intravenous Q12H  . vitamin C  1,000 mg Oral Daily   Continuous Infusions: . lactated ringers     PRN Meds:.bisacodyl, famotidine, magnesium citrate, morphine injection, [DISCONTINUED] ondansetron **OR** ondansetron (ZOFRAN) IV, oxyCODONE  Antibiotics  :    Anti-infectives    Start     Dose/Rate Route Frequency Ordered Stop   12/28/15 1800  vancomycin (VANCOCIN) 1,250 mg in sodium chloride 0.9 % 250 mL IVPB     1,250 mg 166.7 mL/hr over 90 Minutes Intravenous Every 12 hours 12/28/15 1708     12/28/15 1800  Ampicillin-Sulbactam (UNASYN) 3 g in sodium chloride 0.9 % 100 mL IVPB     3 g 100 mL/hr over 60 Minutes Intravenous Every 6 hours 12/28/15 1708          Objective:   Filed Vitals:   12/28/15 2115 12/29/15 0144 12/29/15 0522 12/29/15 0900  BP: 127/56 126/48 136/53 141/63  Pulse: 89 76 74 82  Temp: 98.6  F (37 C) 98.8 F (37.1 C) 99.2 F (37.3 C) 98.6 F (37 C)  TempSrc: Oral Oral Oral Oral  Resp: Height:      Weight:      SpO2: 98% 98% 97% 100%    Wt Readings from Last 3 Encounters:  12/28/15 95.3 kg (210 lb 1.6 oz)  06/10/15 95.346 kg (210 lb 3.2 oz)    No intake or output data in the 24 hours ending 12/29/15 1048   Physical Exam  Awake Alert, Oriented X 3, No new F.N deficits, Normal affect [place].AT,PERRAL Supple Neck,No JVD, No cervical lymphadenopathy appriciated.  Symmetrical Chest wall movement, Good air movement bilaterally, CTAB RRR,No Gallops,Rubs or new Murmurs, No Parasternal Heave +ve B.Sounds, Abd Soft, No tenderness, No organomegaly appriciated, No rebound - guarding or rigidity. No Cyanosis, Clubbing or edema, No new Rash or bruise , L hand under  bandage    Data Review:   Micro Results No results found for this or any previous visit (from the past 240 hour(s)).  Radiology Reports Dg Finger Index Left  12/28/2015  CLINICAL DATA:  Swelling of proximal left second finger. EXAM: LEFT INDEX FINGER 2+V COMPARISON:  None. FINDINGS: Soft tissue swelling is identified of the proximal digit without evidence of fracture, soft tissue foreign body or soft tissue gas. No bony lesions are seen. Very mild proliferative changes are seen involving the MCP and interphalangeal joints. IMPRESSION: Soft tissue swelling without evidence of fracture or foreign body. Electronically Signed   By: Irish Lack M.D.   On: 12/28/2015 13:52     CBC  Recent Labs Lab 12/28/15 1142 12/29/15 0230  WBC 13.2* 11.3*  HGB 10.6* 10.6*  HCT 33.7* 33.4*  PLT 269 253  MCV 91.8 91.3  MCH 28.9 29.0  MCHC 31.5 31.7  RDW 14.9 15.3  LYMPHSABS 1.5  --   MONOABS 1.0  --   EOSABS 0.0  --   BASOSABS 0.0  --     Chemistries   Recent Labs Lab 12/28/15 1142 12/29/15 0230  NA 138 142  K 4.0 3.8  CL 105 108  CO2 23 23  GLUCOSE 109* 94  BUN 18 13  CREATININE 0.94 0.74  CALCIUM 9.5 9.4  AST 27  --   ALT 24  --   ALKPHOS 41  --   BILITOT 0.4  --    ------------------------------------------------------------------------------------------------------------------ No results for input(s): CHOL, HDL, LDLCALC, TRIG, CHOLHDL, LDLDIRECT in the last 72 hours.  Lab Results  Component Value Date   HGBA1C 5.6 06/09/2015   ------------------------------------------------------------------------------------------------------------------ No results for input(s): TSH, T4TOTAL, T3FREE, THYROIDAB in the last 72 hours.  Invalid input(s): FREET3 ------------------------------------------------------------------------------------------------------------------ No results for input(s): VITAMINB12, FOLATE, FERRITIN, TIBC, IRON, RETICCTPCT in the last 72  hours.  Coagulation profile No results for input(s): INR, PROTIME in the last 168 hours.  No results for input(s): DDIMER in the last 72 hours.  Cardiac Enzymes No results for input(s): CKMB, TROPONINI, MYOGLOBIN in the last 168 hours.  Invalid input(s): CK ------------------------------------------------------------------------------------------------------------------    Component Value Date/Time   BNP 173.7* 06/10/2015 0430    Time Spent in minutes   30   Holger Sokolowski K M.D on 12/29/2015 at 10:48 AM  Between 7am to 7pm - Pager - [phone]  After 7pm go to [email or web address] - password [facility]  [facility] -  Office  [phone]

## 2015-12-31 LAB — WOUND CULTURE

## 2016-01-27 ENCOUNTER — Encounter: Payer: Self-pay | Admitting: Gastroenterology

## 2016-03-17 ENCOUNTER — Encounter: Payer: Self-pay | Admitting: Gastroenterology

## 2016-03-17 ENCOUNTER — Ambulatory Visit (AMBULATORY_SURGERY_CENTER): Payer: Self-pay

## 2016-03-17 VITALS — Ht 66.0 in | Wt 234.2 lb

## 2016-03-17 DIAGNOSIS — Z1211 Encounter for screening for malignant neoplasm of colon: Secondary | ICD-10-CM

## 2016-03-17 NOTE — Progress Notes (Signed)
Pt came into the office today for his pre-visit prior to his colonscopy on 03/31/16 Pt is on Pletal and does not know why he is taking it.He is also on a lot other medications.Due to a complicated medical history and taking a blood thinner, the pt was scheduled for an office visit with Amy on 05/15/16 at 10:00am .He was advised to bring all his medications and a family member with him to his appointment.He understood.The colon on 03/31/16 was cancelled.

## 2016-03-31 ENCOUNTER — Encounter: Payer: Medicare Other | Admitting: Gastroenterology

## 2016-05-14 ENCOUNTER — Encounter: Payer: Self-pay | Admitting: Gastroenterology

## 2016-05-15 ENCOUNTER — Ambulatory Visit: Payer: Medicare Other | Admitting: Gastroenterology

## 2016-07-22 ENCOUNTER — Ambulatory Visit: Payer: Medicare Other | Admitting: Gastroenterology

## 2016-08-31 ENCOUNTER — Ambulatory Visit (INDEPENDENT_AMBULATORY_CARE_PROVIDER_SITE_OTHER): Payer: Medicare Other

## 2016-08-31 ENCOUNTER — Encounter (HOSPITAL_COMMUNITY): Payer: Self-pay | Admitting: *Deleted

## 2016-08-31 ENCOUNTER — Ambulatory Visit (HOSPITAL_COMMUNITY)
Admission: EM | Admit: 2016-08-31 | Discharge: 2016-08-31 | Disposition: A | Discharge status: Home / Self Care | Payer: Medicare Other | Attending: Emergency Medicine | Admitting: Emergency Medicine

## 2016-08-31 DIAGNOSIS — M79605 Pain in left leg: Secondary | ICD-10-CM | POA: Diagnosis not present

## 2016-08-31 DIAGNOSIS — S93402A Sprain of unspecified ligament of left ankle, initial encounter: Secondary | ICD-10-CM | POA: Diagnosis not present

## 2016-08-31 MED ORDER — NAPROXEN 375 MG PO TABS: 375.0000 mg | ORAL_TABLET | Freq: Two times a day (BID) | ORAL | 0 refills | Status: DC

## 2016-08-31 NOTE — ED Provider Notes (Signed)
HPI  SUBJECTIVE:  Gregory Walker is a 70 y.o. male who presents with 3 days as sharp, throbbing, constant ankle pain and swelling. Reports constant posterior calf muscle "soreness" starting yesterday. He does not recall falling or twisting his ankle, but states that he does have the yard full holes and thinks that he may have stepped someone. He reports swelling, numbness and tingling on the top of his foot. He states that he is unable to weight-bear without using his cane. Symptoms are better with walking although he states that it is more difficult secondary to the pain, worse with sitting still. He tried icy hot and using a cane. No previous history of injury to this ankle. No calf swelling. He has an extensive past medical history including hypertension, CVA, diabetes with peripheral neuropathy, hypertension, MI, hypercholesterolemia on a statin. No history DVT, PE, myalgias secondary to statin, kidney disease. He is on aspirin 81 mg daily. He takes Percocet 10/325 and tramadol on a regular basis. PMD: Dr. Katherine Roan.   Past Medical History:  Diagnosis Date  . Acute MI   . Anemia of chronic disease   . Diabetes mellitus   . Diabetic neuropathy ([place])   . Hyperlipidemia   . Hypertension   . Leukocytosis   . Stroke [facility])     Past Surgical History:  Procedure Laterality Date  . EYE SURGERY      History reviewed. No pertinent family history.  Social History  Substance Use Topics  . Smoking status: Current Some Day Smoker  . Smokeless tobacco: Not on file  . Alcohol use No    No current facility-administered medications for this encounter.   Current Outpatient Prescriptions:  .  ALPRAZolam (XANAX) 0.5 MG tablet, Take 0.5 mg by mouth 2 (two) times daily., Disp: , Rfl: 0 .  budesonide-formoterol (SYMBICORT) 80-4.5 MCG/ACT inhaler, Inhale 2 puffs into the lungs 2 (two) times daily., Disp: , Rfl:  .  Choline Fenofibrate (FENOFIBRIC ACID) 135 MG CPDR, Take 135 mg by mouth daily. ,  Disp: , Rfl: 0 .  cilostazol (PLETAL) 50 MG tablet, Take 100 mg by mouth 2 (two) times daily. , Disp: , Rfl: 0 .  ezetimibe (ZETIA) 10 MG tablet, Take 10 mg by mouth daily., Disp: , Rfl:  .  fluticasone (FLONASE) 50 MCG/ACT nasal spray, Place 2 sprays into both nostrils 2 (two) times daily. , Disp: , Rfl: 0 .  gabapentin (NEURONTIN) 300 MG capsule, Take 600 mg by mouth 2 (two) times daily. , Disp: , Rfl: 0 .  HUMALOG MIX 75/25 (75-25) 100 UNIT/ML SUSP injection, Inject 30 Units into the skin 2 (two) times daily as needed (CBG >124). , Disp: , Rfl: 0 .  meloxicam (MOBIC) 7.5 MG tablet, Take 7.5 mg by mouth 2 (two) times daily., Disp: , Rfl: 0 .  oxyCODONE-acetaminophen (PERCOCET) 10-325 MG per tablet, Take 1 tablet by mouth every 6 (six) hours as needed for pain. , Disp: , Rfl: 0 .  pioglitazone-metformin (ACTOPLUS MET) 15-850 MG per tablet, Take 1 tablet by mouth at bedtime. , Disp: , Rfl: 0 .  saxagliptin HCl (ONGLYZA) 5 MG TABS tablet, Take 5 mg by mouth daily., Disp: , Rfl:  .  simvastatin (ZOCOR) 10 MG tablet, Take 10 mg by mouth at bedtime. , Disp: , Rfl: 0 .  tamsulosin (FLOMAX) 0.4 MG CAPS capsule, Take 0.4 mg by mouth daily. , Disp: , Rfl: 0 .  tiZANidine (ZANAFLEX) 2 MG tablet, Take 2 mg by mouth 2 (two)  times daily. , Disp: , Rfl: 0 .  traMADol (ULTRAM) 50 MG tablet, Take 50 mg by mouth 2 (two) times daily. scheduled, Disp: , Rfl: 0 .  valsartan-hydrochlorothiazide (DIOVAN-HCT) 160-25 MG per tablet, Take 1 tablet by mouth daily. , Disp: , Rfl: 0  Allergies  Allergen Reactions  . Codeine Itching     ROS  As noted in HPI.   Physical Exam  BP (!) 135/53 (BP Location: Left Arm) Comment: rn notified   Pulse 75   Temp 97.9 F (36.6 C) (Oral)   Resp 12   SpO2 100%   Constitutional: Well developed, well nourished, no acute distress Eyes:  EOMI, conjunctiva normal bilaterally HENT: Normocephalic, atraumatic,mucus membranes moist Respiratory: Normal inspiratory  effort Cardiovascular: Normal rate GI: nondistended skin: No rash, skin intact Musculoskeletal:  L Distal fibula tender, Medial malleolus NT,  Deltoid ligament medially NT.  Lateral soft tissue swelling, Proximal fibula NT,  Lateral ligaments tender, ATFL laterally tender, Achilles NT, calcaneus NT,  Proximal 5th metatarsal NT, Midfoot NT, distal NVI with baseline sensation / motor to foot with CR<2 seconds. +bruising.- squeeze test.  Diffuse tenderness over the gastrox. No popliteal tenderness. Calves symmetric, right side 41 cm left calf 40 cm.. Pt able to bear weight in dept.  Neurologic: Alert & oriented x 3, no focal neuro deficits Psychiatric: Speech and behavior appropriate   ED Course   Medications - No data to display  Orders Placed This Encounter  Procedures  . DG Ankle Complete Left    Standing Status:   Standing    Number of Occurrences:   1    Order Specific Question:   Reason for Exam (SYMPTOM  OR DIAGNOSIS REQUIRED)    Answer:   r/o fx  . AMB referral to orthopedics    Referral Priority:   Routine    Referral Type:   Consultation    Referral Reason:   Specialty Services Required    Number of Visits Requested:   1  . Apply ASO ankle    Standing Status:   Standing    Number of Occurrences:   1    Order Specific Question:   Laterality    Answer:   Left    No results found for this or any previous visit (from the past 24 hour(s)). Dg Ankle Complete Left  Result Date: 08/31/2016 CLINICAL DATA:  Pain for 3 days. EXAM: LEFT ANKLE COMPLETE - 3+ VIEW COMPARISON:  None. FINDINGS: No acute fracture or dislocation. The ankle mortise is approximated. Mild osteophytosis of the distal tibia and visualized posterior midfoot. Small plantar calcaneal enthesophyte. Mild soft tissue swelling overlying the lateral malleolus. IMPRESSION: No acute fracture, dislocation or advanced arthropathy of the left ankle. Electronically Signed   By: Ulyses Jarred M.D.   On: 08/31/2016 16:21     ED Clinical Impression  Sprain of left ankle, unspecified ligament, initial encounter  Left leg pain   ED Assessment/Plan  [facility] narcotic database reviewed patient was prescribed 150 Percocet 10/325 on 10/4 by PA [employer]. This is a 30 day supply. Also has a prescription for tramadol #60 filled on 9/30 with refills, alprazolam #60 filled 9/18 Dr. Katherine Roan.   Doubt statin induced myalgias, doubt DVT as he is on aspirin 81 mg and an antiplatelet medication. Suspect that this is MSK pain is from the altered mechanics as he has been walking differently for the past 3 days.  reviewed imaging independently. No acute fracture. No advanced arthropathy or dislocation  per radiology. See radiology report for details.  Patient already has a cane, will send home with an ASO for extra support. Home with Naprosyn for 7 days. We'll provide orthopedic referral if not better with conservative care in a week to 10 days. Dr. Lorin Mercy at [facility] orthopedics is on call.  Discussed imaging, MDM, plan and followup with patient. Discussed sn/sx that should prompt return to the ED. Patient agrees with plan.   No orders of the defined types were placed in this encounter.   *This clinic note was created using Dragon dictation software. Therefore, there may be occasional mistakes despite careful proofreading.  ?   Melynda Ripple, MD 09/01/16 1136

## 2016-08-31 NOTE — ED Triage Notes (Signed)
Pt  Reports  Pain l  Leg    X  3  Days   denys  Any  specefic  Injury  Pain  Radiates  From the  Ankle  Up  The  Leg      Pt denys  Any  specefic  Injury    He  Ambulates  With a  Slow   Steady  Gait        He  Has  A  History  Of  Diabetes

## 2016-09-04 ENCOUNTER — Ambulatory Visit (INDEPENDENT_AMBULATORY_CARE_PROVIDER_SITE_OTHER): Payer: Medicare Other | Admitting: Podiatry

## 2016-09-04 VITALS — BP 132/76

## 2016-09-04 DIAGNOSIS — M779 Enthesopathy, unspecified: Secondary | ICD-10-CM

## 2016-09-04 DIAGNOSIS — M722 Plantar fascial fibromatosis: Secondary | ICD-10-CM | POA: Diagnosis not present

## 2016-09-04 DIAGNOSIS — E1140 Type 2 diabetes mellitus with diabetic neuropathy, unspecified: Secondary | ICD-10-CM | POA: Diagnosis not present

## 2016-09-04 NOTE — Progress Notes (Signed)
   Subjective:    Patient ID: Gregory Walker Medico, male    DOB: 09/06/1946, 70 y.o.   MRN: [id number]  HPI this patient presents to the office with chief complaint of a painful left foot and ankle. He states he has pain in the bottom of his heel as he walks each and every step. He says he also has pain that shoots up from the foot up into the leg as he walks. He said the pain got severe that he went to the emergency department last night due to the pain that he was experiencing as he walks. He was treated with x-rays and given Naprosyn for the pain. He presents the office today stating that his foot has still been painful for the last 4-5 days and he presents the office today for an evaluation and treatment. He relates that his left foot is significantly more numb than his right foot. Patient does admit having back problems.  Patient is also diabetic.    Review of Systems  All other systems reviewed and are negative.      Objective:   Physical Exam GENERAL APPEARANCE: Alert, conversant. Appropriately groomed. No acute distress.  VASCULAR: Pedal pulses are  B[facility]  [facility]  [facility].  Capillary refill time is immediate to all digits,  Normal temperature gradient.  Digital hair growth is present bilateral  NEUROLOGIC: sensation is normal to 5.07 monofilament at 5/5 sites right.  Barely felt left foot.  Light touch is intact bilateral, Muscle strength normal.  MUSCULOSKELETAL: acceptable muscle strength, tone and stability bilateral.  Intrinsic muscluature intact bilateral.  Rectus appearance of foot and digits noted bilateral. Palpable pain at the insertion plantar fascia left foot.  Pain along the PTT and peroneal tendon left foot.  Sinus tarsi pain palpable.  DERMATOLOGIC: skin color, texture, and turgor are within normal limits.  No preulcerative lesions or ulcers  are seen, no interdigital maceration noted.  No open lesions present.  Digital nails are asymptomatic. No drainage  noted.         Assessment & Plan:  Diagnisis  Acute plantar fascitis left   Ankle tendinitis left foot.   IE  X-rays were reviewed from last night revealing no true ankle pathology, but there is calcification noted at the insertion of the plantar fascia left foot. Due to the severity of the heel pain and the pain around the ankle, I decided to treat him with an Unna boot and a wooden shoe for one week. He is to return the office in one week for continued evaluation. He was told to continue taking his Naprosyn as directed. Return to the clinic 1 week   Helane GuntherGregory Mayer DPM

## 2016-09-05 ENCOUNTER — Emergency Department (HOSPITAL_BASED_OUTPATIENT_CLINIC_OR_DEPARTMENT_OTHER)
Admit: 2016-09-05 | Discharge: 2016-09-05 | Disposition: A | Discharge status: Home / Self Care | Payer: Medicare Other | Attending: Emergency Medicine | Admitting: Emergency Medicine

## 2016-09-05 ENCOUNTER — Encounter (HOSPITAL_COMMUNITY): Payer: Self-pay | Admitting: Emergency Medicine

## 2016-09-05 ENCOUNTER — Emergency Department (HOSPITAL_COMMUNITY)
Admission: EM | Admit: 2016-09-05 | Discharge: 2016-09-05 | Disposition: A | Discharge status: Home / Self Care | Payer: Medicare Other | Attending: Emergency Medicine | Admitting: Emergency Medicine

## 2016-09-05 ENCOUNTER — Telehealth (HOSPITAL_COMMUNITY): Payer: Self-pay

## 2016-09-05 DIAGNOSIS — M79609 Pain in unspecified limb: Secondary | ICD-10-CM

## 2016-09-05 DIAGNOSIS — I824Z2 Acute embolism and thrombosis of unspecified deep veins of left distal lower extremity: Secondary | ICD-10-CM

## 2016-09-05 DIAGNOSIS — Z8673 Personal history of transient ischemic attack (TIA), and cerebral infarction without residual deficits: Secondary | ICD-10-CM | POA: Insufficient documentation

## 2016-09-05 DIAGNOSIS — E1140 Type 2 diabetes mellitus with diabetic neuropathy, unspecified: Secondary | ICD-10-CM | POA: Insufficient documentation

## 2016-09-05 DIAGNOSIS — F17200 Nicotine dependence, unspecified, uncomplicated: Secondary | ICD-10-CM | POA: Insufficient documentation

## 2016-09-05 DIAGNOSIS — I252 Old myocardial infarction: Secondary | ICD-10-CM | POA: Insufficient documentation

## 2016-09-05 DIAGNOSIS — I10 Essential (primary) hypertension: Secondary | ICD-10-CM | POA: Diagnosis not present

## 2016-09-05 DIAGNOSIS — I82402 Acute embolism and thrombosis of unspecified deep veins of left lower extremity: Secondary | ICD-10-CM | POA: Diagnosis not present

## 2016-09-05 DIAGNOSIS — Z79899 Other long term (current) drug therapy: Secondary | ICD-10-CM | POA: Diagnosis not present

## 2016-09-05 DIAGNOSIS — M7989 Other specified soft tissue disorders: Secondary | ICD-10-CM | POA: Diagnosis not present

## 2016-09-05 DIAGNOSIS — M79672 Pain in left foot: Secondary | ICD-10-CM | POA: Diagnosis present

## 2016-09-05 MED ORDER — RIVAROXABAN (XARELTO) VTE STARTER PACK (15 & 20 MG): 15.0000 mg | ORAL_TABLET | Freq: Two times a day (BID) | ORAL | 0 refills | Status: DC

## 2016-09-05 MED ORDER — RIVAROXABAN (XARELTO) EDUCATION KIT FOR DVT/PE PATIENTS
PACK | Freq: Once | Status: DC
  Filled 2016-09-05: qty 1

## 2016-09-05 NOTE — Telephone Encounter (Signed)
Pharmacy calling pts insurance will not cover Xarelto Rx without prior auth.  Spoke w/R. Browning PA and per EPIC info pt should've been given kit which has card for 1st 30 days of medication free.  Phararmacist informed.  Pt instructed to return to ED if didn't get kit

## 2016-09-05 NOTE — ED Provider Notes (Signed)
[facility] DEPT Provider Note   CSN: [id number] Arrival date & time: 09/05/16  1411     History   Chief Complaint Chief Complaint  Patient presents with  . Leg Pain  . Foot Pain    HPI Gregory Walker is a 70 y.o. male.  HPI The patient reports he was seen by a podiatrist on Thursday. He reports he put a wrap on his foot but he is continuing to have pain in his ankle and his calf. He reports his calf is aching like a toothache. He denies he had any wounds underneath his dressing. He reports he had taken most of the dressing off of the podiatrist and placed him for some additional tape on it. He says a Slipping off. He reports he was told if the pain continued he was to be seen at the emergency department. He denies he's had any chest pain or shortness of breath. He reports pain is worse with walking and weightbearing. Past Medical History:  Diagnosis Date  . Acute MI   . Anemia of chronic disease   . Diabetes mellitus   . Diabetic neuropathy ([place])   . Hyperlipidemia   . Hypertension   . Leukocytosis   . Stroke [facility])     Patient Active Problem List   Diagnosis Date Noted  . Cellulitis and abscess 12/28/2015  . Hyperlipidemia 12/28/2015  . Leukocytosis 12/28/2015  . Anemia of chronic disease 12/28/2015  . Abscess of right hand including fingers 12/28/2015  . CAP (community acquired pneumonia) 06/08/2015  . Diabetes ([place]) 06/08/2015  . HTN (hypertension) 06/08/2015  . Pneumonia 06/08/2015    Past Surgical History:  Procedure Laterality Date  . EYE SURGERY         Home Medications    Prior to Admission medications   Medication Sig Start Date End Date Taking? Authorizing Provider  ALPRAZolam Duanne Moron) 0.5 MG tablet Take 0.5 mg by mouth 2 (two) times daily. 05/15/15   Historical Provider, MD  budesonide-formoterol (SYMBICORT) 80-4.5 MCG/ACT inhaler Inhale 2 puffs into the lungs 2 (two) times daily.    Historical Provider, MD  Choline Fenofibrate (FENOFIBRIC  ACID) 135 MG CPDR Take 135 mg by mouth daily.  05/30/15   Historical Provider, MD  cilostazol (PLETAL) 50 MG tablet Take 100 mg by mouth 2 (two) times daily.  06/06/15   Historical Provider, MD  ezetimibe (ZETIA) 10 MG tablet Take 10 mg by mouth daily.    Historical Provider, MD  fluticasone (FLONASE) 50 MCG/ACT nasal spray Place 2 sprays into both nostrils 2 (two) times daily.  05/30/15   Historical Provider, MD  gabapentin (NEURONTIN) 300 MG capsule Take 600 mg by mouth 2 (two) times daily.  05/30/15   Historical Provider, MD  HUMALOG MIX 75/25 (75-25) 100 UNIT/ML SUSP injection Inject 30 Units into the skin 2 (two) times daily as needed (CBG >124).  03/25/15   Historical Provider, MD  meloxicam (MOBIC) 7.5 MG tablet Take 7.5 mg by mouth 2 (two) times daily. 05/14/15   Historical Provider, MD  naproxen (NAPROSYN) 375 MG tablet Take 1 tablet (375 mg total) by mouth 2 (two) times daily. 08/31/16   Melynda Ripple, MD  oxyCODONE-acetaminophen (PERCOCET) 10-325 MG per tablet Take 1 tablet by mouth every 6 (six) hours as needed for pain.  05/31/15   Historical Provider, MD  pioglitazone-metformin (ACTOPLUS MET) 15-850 MG per tablet Take 1 tablet by mouth at bedtime.  05/14/15   Historical Provider, MD  Rivaroxaban 15 & 20 MG TBPK  Take 15 mg by mouth 2 (two) times daily. Take as directed on package: Start with one 48m tablet by mouth twice a day with food. On Day 22, switch to one 251mtablet once a day with food. 09/05/16   MaCharlesetta ShanksMD  saxagliptin HCl (ONGLYZA) 5 MG TABS tablet Take 5 mg by mouth daily.    Historical Provider, MD  simvastatin (ZOCOR) 10 MG tablet Take 10 mg by mouth at bedtime.  05/27/15   Historical Provider, MD  tamsulosin (FLOMAX) 0.4 MG CAPS capsule Take 0.4 mg by mouth daily.  06/06/15   Historical Provider, MD  tiZANidine (ZANAFLEX) 2 MG tablet Take 2 mg by mouth 2 (two) times daily.  05/29/15   Historical Provider, MD  traMADol (ULTRAM) 50 MG tablet Take 50 mg by mouth 2 (two) times  daily. scheduled 05/31/15   Historical Provider, MD  valsartan-hydrochlorothiazide (DIOVAN-HCT) 160-25 MG per tablet Take 1 tablet by mouth daily.  05/27/15   Historical Provider, MD    Family History No family history on file.  Social History Social History  Substance Use Topics  . Smoking status: Current Some Day Smoker  . Smokeless tobacco: Current User  . Alcohol use No     Allergies   Codeine   Review of Systems Review of Systems 10 Systems reviewed and are negative for acute change except as noted in the HPI.   Physical Exam Updated Vital Signs BP 122/67 (BP Location: Left Arm)   Pulse 106   Temp 98.1 F (36.7 C) (Oral)   Resp 17   Ht 5' 6"  (1.676 m)   Wt 220 lb (99.8 kg)   SpO2 99%   BMI 35.51 kg/m   Physical Exam  Constitutional: He is oriented to person, place, and time. He appears well-developed and well-nourished. No distress.  HENT:  Patient has had old facial trauma very well healed.  Cardiovascular: Normal rate.   2\6 systolic ejection murmur.  Pulmonary/Chest: Effort normal and breath sounds normal.  Musculoskeletal: Normal range of motion.  Patient endorses calf tenderness to palpation of the left lower extremity. He does not have any swelling or edema of the foot or the ankle.  Neurological: He is alert and oriented to person, place, and time. Coordination normal.  Skin: Skin is warm and dry.  Psychiatric: He has a normal mood and affect.     ED Treatments / Results  Labs (all labs ordered are listed, but only abnormal results are displayed) Labs Reviewed - No data to display  EKG  EKG Interpretation None       Radiology No results found.  Procedures Procedures (including critical care time)  Medications Ordered in ED Medications  rivaroxaban (XAlveda ReasonsEducation Kit for DVT/PE patients (not administered)     Initial Impression / Assessment and Plan / ED Course  I have reviewed the triage vital signs and the nursing  notes.  Pertinent labs & imaging results that were available during my care of the patient were reviewed by me and considered in my medical decision making (see chart for details).  Clinical Course    Final Clinical Impressions(s) / ED Diagnoses   Final diagnoses:  Acute deep vein thrombosis (DVT) of distal vein of left lower extremity ([place] Patient has had several days of pain and swelling of the left lower extremity. Today he identified the calf as being an area of significant pain. He had also earlier identified the foot and the ankle. On today's exam the foot and  ankle do not show erythema, effusion or redness to suggest infectious or other acute problems. DVT study was positive for acute clot. Patient is symptomatic and tender to compression. We'll start him on Xarelto started pack and have him follow-up with his primary care physician. Patient denies any chest pain or shortness of breath as associated symptoms.  New Prescriptions New Prescriptions   RIVAROXABAN 15 & 20 MG TBPK    Take 15 mg by mouth 2 (two) times daily. Take as directed on package: Start with one 76m tablet by mouth twice a day with food. On Day 22, switch to one 236mtablet once a day with food.     MaCharlesetta ShanksMD 09/05/16 [phone]

## 2016-09-05 NOTE — ED Notes (Signed)
Spoke with pharmacy about medication information pack they will bring pack and come and educate the pt.

## 2016-09-05 NOTE — ED Triage Notes (Signed)
I went to foot doctor on Friday but didn't help any.

## 2016-09-05 NOTE — ED Triage Notes (Signed)
Pt. Stated, My left calf and foot is aching like a tooth ache for a week.

## 2016-09-05 NOTE — ED Notes (Signed)
Pt brought back to room via wheelchair appears in no distress

## 2016-09-05 NOTE — Discharge Instructions (Signed)
Information on my medicine - XARELTO (rivaroxaban)  This medication education was reviewed with me or my healthcare representative as part of my discharge preparation.  The pharmacist that spoke with me during my hospital stay was:  Delle ReiningBall, Jacky Hartung [place], [facility]  WHY WAS Carlena HurlXARELTO PRESCRIBED FOR YOU? Xarelto was prescribed to treat blood clots that may have been found in the veins of your legs (deep vein thrombosis) or in your lungs (pulmonary embolism) and to reduce the risk of them occurring again.  What do you need to know about Xarelto? The starting dose is one 15 mg tablet taken TWICE daily with food for the FIRST 21 DAYS then on (enter date)  09/27/16  the dose is changed to one 20 mg tablet taken ONCE A DAY with your evening meal.  DO NOT stop taking Xarelto without talking to the health care provider who prescribed the medication.  Refill your prescription for 20 mg tablets before you run out.  After discharge, you should have regular check-up appointments with your healthcare provider that is prescribing your Xarelto.  In the future your dose may need to be changed if your kidney function changes by a significant amount.  What do you do if you miss a dose? If you are taking Xarelto TWICE DAILY and you miss a dose, take it as soon as you remember. You may take two 15 mg tablets (total 30 mg) at the same time then resume your regularly scheduled 15 mg twice daily the next day.  If you are taking Xarelto ONCE DAILY and you miss a dose, take it as soon as you remember on the same day then continue your regularly scheduled once daily regimen the next day. Do not take two doses of Xarelto at the same time.   Important Safety Information Xarelto is a blood thinner medicine that can cause bleeding. You should call your healthcare provider right away if you experience any of the following: ? Bleeding from an injury or your nose that does not stop. ? Unusual colored urine (red or dark brown) or  unusual colored stools (red or black). ? Unusual bruising for unknown reasons. ? A serious fall or if you hit your head (even if there is no bleeding).  Some medicines may interact with Xarelto and might increase your risk of bleeding while on Xarelto. To help avoid this, consult your healthcare provider or pharmacist prior to using any new prescription or non-prescription medications, including herbals, vitamins, non-steroidal anti-inflammatory drugs (NSAIDs) and supplements.  This website has more information on Xarelto: VisitDestination.com.[email or web address].xarelto.com.

## 2016-09-05 NOTE — Progress Notes (Signed)
VASCULAR LAB PRELIMINARY  PRELIMINARY  PRELIMINARY  PRELIMINARY  Left lower extremity venous duplex completed.    Preliminary report:  There is acute, occlusive DVT noted in the posterior tibial and peroneal veins of the left lower extremity and acute, non occlusive, DVT noted in the left popliteal vein.    Called report to Dr. Octavio GravesPfeiffer  Gregory Walker, [place]AND[place], RVT 09/05/2016, 4:41 PM

## 2016-09-11 ENCOUNTER — Encounter: Payer: Self-pay | Admitting: Podiatry

## 2016-09-11 ENCOUNTER — Ambulatory Visit (INDEPENDENT_AMBULATORY_CARE_PROVIDER_SITE_OTHER): Payer: Medicare Other | Admitting: Podiatry

## 2016-09-11 VITALS — BP 123/66 | HR 104 | Resp 14

## 2016-09-11 DIAGNOSIS — M722 Plantar fascial fibromatosis: Secondary | ICD-10-CM

## 2016-09-11 DIAGNOSIS — E1140 Type 2 diabetes mellitus with diabetic neuropathy, unspecified: Secondary | ICD-10-CM

## 2016-09-11 DIAGNOSIS — I82432 Acute embolism and thrombosis of left popliteal vein: Secondary | ICD-10-CM | POA: Diagnosis not present

## 2016-09-11 NOTE — Progress Notes (Signed)
This patient returns to my office follow-up for diagnosis in my office of an acute plantar fasciitis, left foot and ankle tendinitis, left foot. He was treated with an Unna boot on his left foot. He says that after the application of the Unna boot he started developing extreme pain noted in the back of his left leg and went to the emergency department is diagnosed with an acute DVT on his left artery behind his knee. He has no history of chest pain or shortness of breath. At this point, he presents the office today without issue stating he still experiencing significant pain and discomfort in the left foot as he walks. He has a follow-up appointment with Dr. Petra KubaKilpatrick this coming Monday. This patient is diabetic and has diabetic neuropathy. He was prescribed Rivaroxaban at home.   GENERAL APPEARANCE: Alert, conversant. Appropriately groomed. No acute distress.  VASCULAR: Pedal pulses are minimally   palpable at  [facility].  Capillary refill time is immediate to all digits,  Normal temperature gradient.   NEUROLOGIC: sensation is diminished  to 5.07 monofilament at 5/5 sites bilateral.  Light touch is intact bilateral, Muscle strength normal.  MUSCULOSKELETAL: acceptable muscle strength, tone and stability bilateral.  Intrinsic muscluature intact bilateral.  Rectus appearance of foot and digits noted bilateral. Pain is centered at insertion plantar fascia left foot.  Swelling and pain located in calf and knee.  DERMATOLOGIC: skin color, texture, and turgor are within normal limits.  No preulcerative lesions or ulcers  are seen, no interdigital maceration noted.  No open lesions present.  Digital nails are asymptomatic. No drainage noted.  Plantar fascitis   DVT  ROV  examine the patient's foot today and there was no evidence of any pain along the ankle tendons and no redness or swelling in the foot itself all his pain and discomfort is coming from his calf.  Most of his foot. Pain comes as he  steps during gait.  Dispense elastic compression sock to allow compression without immobilization.  Patient is scheduled to see Dr. Petra KubaKilpatrick on Monday   Helane GuntherGregory Renlee Walker DPM

## 2016-09-15 ENCOUNTER — Observation Stay (HOSPITAL_COMMUNITY)
Admission: EM | Admit: 2016-09-15 | Discharge: 2016-09-16 | Discharge status: Against Medical Advice | Payer: Medicare Other | Attending: Internal Medicine | Admitting: Internal Medicine

## 2016-09-15 ENCOUNTER — Observation Stay (HOSPITAL_COMMUNITY): Payer: Medicare Other

## 2016-09-15 ENCOUNTER — Encounter (HOSPITAL_COMMUNITY): Payer: Self-pay

## 2016-09-15 DIAGNOSIS — D649 Anemia, unspecified: Secondary | ICD-10-CM | POA: Diagnosis not present

## 2016-09-15 DIAGNOSIS — E1140 Type 2 diabetes mellitus with diabetic neuropathy, unspecified: Secondary | ICD-10-CM | POA: Diagnosis not present

## 2016-09-15 DIAGNOSIS — R195 Other fecal abnormalities: Secondary | ICD-10-CM | POA: Insufficient documentation

## 2016-09-15 DIAGNOSIS — N179 Acute kidney failure, unspecified: Secondary | ICD-10-CM | POA: Insufficient documentation

## 2016-09-15 DIAGNOSIS — Z7902 Long term (current) use of antithrombotics/antiplatelets: Secondary | ICD-10-CM | POA: Insufficient documentation

## 2016-09-15 DIAGNOSIS — I252 Old myocardial infarction: Secondary | ICD-10-CM | POA: Insufficient documentation

## 2016-09-15 DIAGNOSIS — Z8673 Personal history of transient ischemic attack (TIA), and cerebral infarction without residual deficits: Secondary | ICD-10-CM | POA: Insufficient documentation

## 2016-09-15 DIAGNOSIS — Z79899 Other long term (current) drug therapy: Secondary | ICD-10-CM | POA: Insufficient documentation

## 2016-09-15 DIAGNOSIS — D638 Anemia in other chronic diseases classified elsewhere: Principal | ICD-10-CM | POA: Insufficient documentation

## 2016-09-15 DIAGNOSIS — Z791 Long term (current) use of non-steroidal anti-inflammatories (NSAID): Secondary | ICD-10-CM | POA: Diagnosis not present

## 2016-09-15 DIAGNOSIS — E785 Hyperlipidemia, unspecified: Secondary | ICD-10-CM | POA: Insufficient documentation

## 2016-09-15 DIAGNOSIS — Z86718 Personal history of other venous thrombosis and embolism: Secondary | ICD-10-CM

## 2016-09-15 DIAGNOSIS — F17200 Nicotine dependence, unspecified, uncomplicated: Secondary | ICD-10-CM | POA: Insufficient documentation

## 2016-09-15 DIAGNOSIS — Z79891 Long term (current) use of opiate analgesic: Secondary | ICD-10-CM | POA: Insufficient documentation

## 2016-09-15 DIAGNOSIS — I82432 Acute embolism and thrombosis of left popliteal vein: Secondary | ICD-10-CM | POA: Diagnosis not present

## 2016-09-15 DIAGNOSIS — Z7951 Long term (current) use of inhaled steroids: Secondary | ICD-10-CM | POA: Insufficient documentation

## 2016-09-15 DIAGNOSIS — G8929 Other chronic pain: Secondary | ICD-10-CM | POA: Diagnosis not present

## 2016-09-15 DIAGNOSIS — K922 Gastrointestinal hemorrhage, unspecified: Secondary | ICD-10-CM

## 2016-09-15 DIAGNOSIS — Z794 Long term (current) use of insulin: Secondary | ICD-10-CM | POA: Diagnosis not present

## 2016-09-15 DIAGNOSIS — E11649 Type 2 diabetes mellitus with hypoglycemia without coma: Secondary | ICD-10-CM

## 2016-09-15 DIAGNOSIS — E119 Type 2 diabetes mellitus without complications: Secondary | ICD-10-CM

## 2016-09-15 DIAGNOSIS — I10 Essential (primary) hypertension: Secondary | ICD-10-CM | POA: Insufficient documentation

## 2016-09-15 DIAGNOSIS — R109 Unspecified abdominal pain: Secondary | ICD-10-CM

## 2016-09-15 DIAGNOSIS — R140 Abdominal distension (gaseous): Secondary | ICD-10-CM

## 2016-09-15 LAB — BASIC METABOLIC PANEL
ANION GAP: 12 (ref 5–15)
BUN: 60 mg/dL — ABNORMAL HIGH (ref 6–20)
CO2: 28 mmol/L (ref 22–32)
Calcium: 8.9 mg/dL (ref 8.9–10.3)
Chloride: 100 mmol/L — ABNORMAL LOW (ref 101–111)
Creatinine, Ser: 1.73 mg/dL — ABNORMAL HIGH (ref 0.61–1.24)
GFR calc Af Amer: 45 mL/min — ABNORMAL LOW (ref >60)
GFR, EST NON AFRICAN AMERICAN: 39 mL/min — AB (ref >60)
Glucose, Bld: 77 mg/dL (ref 65–99)
POTASSIUM: 3.7 mmol/L (ref 3.5–5.1)
SODIUM: 140 mmol/L (ref 135–145)

## 2016-09-15 LAB — CBG MONITORING, ED: GLUCOSE-CAPILLARY: 83 mg/dL (ref 65–99)

## 2016-09-15 LAB — URINALYSIS, ROUTINE W REFLEX MICROSCOPIC
Bilirubin Urine: NEGATIVE
Glucose, UA: NEGATIVE mg/dL
Hgb urine dipstick: NEGATIVE
KETONES UR: NEGATIVE mg/dL
LEUKOCYTES UA: NEGATIVE
NITRITE: NEGATIVE
PH: 7.5 (ref 5.0–8.0)
Protein, ur: NEGATIVE mg/dL
SPECIFIC GRAVITY, URINE: 1.015 (ref 1.005–1.030)

## 2016-09-15 LAB — CBC
HEMATOCRIT: 20.2 % — AB (ref 39.0–52.0)
HEMOGLOBIN: 6.2 g/dL — AB (ref 13.0–17.0)
MCH: 25.6 pg — ABNORMAL LOW (ref 26.0–34.0)
MCHC: 30.7 g/dL (ref 30.0–36.0)
MCV: 83.5 fL (ref 78.0–100.0)
Platelets: 740 10*3/uL — ABNORMAL HIGH (ref 150–400)
RBC: 2.42 MIL/uL — ABNORMAL LOW (ref 4.22–5.81)
RDW: 17.1 % — AB (ref 11.5–15.5)
WBC: 13 10*3/uL — AB (ref 4.0–10.5)

## 2016-09-15 LAB — POC OCCULT BLOOD, ED: Fecal Occult Bld: POSITIVE — AB

## 2016-09-15 LAB — PREPARE RBC (CROSSMATCH)

## 2016-09-15 LAB — ABO/RH: ABO/RH(D): A POS

## 2016-09-15 MED ORDER — ACETAMINOPHEN 325 MG PO TABS
650.0000 mg | ORAL_TABLET | Freq: Once | ORAL | Status: AC
  Administered 2016-09-15: 650 mg via ORAL
  Filled 2016-09-15: qty 2

## 2016-09-15 MED ORDER — SODIUM CHLORIDE 0.9 % IV SOLN
Freq: Once | INTRAVENOUS | Status: AC
  Administered 2016-09-15: 23:00:00 via INTRAVENOUS

## 2016-09-15 MED ORDER — DIPHENHYDRAMINE HCL 25 MG PO CAPS
25.0000 mg | ORAL_CAPSULE | Freq: Once | ORAL | Status: AC
  Administered 2016-09-15: 25 mg via ORAL
  Filled 2016-09-15: qty 1

## 2016-09-15 MED ORDER — PANTOPRAZOLE SODIUM 40 MG IV SOLR
40.0000 mg | Freq: Two times a day (BID) | INTRAVENOUS | Status: DC
  Administered 2016-09-15: 40 mg via INTRAVENOUS
  Filled 2016-09-15: qty 40

## 2016-09-15 NOTE — ED Provider Notes (Signed)
[facility] DEPT Provider Note   CSN: [id number] Arrival date & time: 09/15/16  1705     History   Chief Complaint Chief Complaint  Patient presents with  . Abnormal Lab    HPI Gregory Walker is a 70 y.o. male.  70 year old African-American male past medical history significant for diabetes, hypertension, DVT currently on xarelto for 2 weeks that presents to the ED today for abnormal lab. Patient was seen at his PCP yesterday for continued care of his lower extremity DVT, fatigue, weakness, SOB/DOE. Patient's hemoglobin was noted to be low. His doctor called him today and told to come to the ED. Patient states that he has been more short of breath on exertion for the past few days. He also endorses generalized weakness and dizziness. Nothing makes better or worse. Acute in onset. Gradually worsening. Patient states that he has a history of shortness of breath but this seems to be getting worse. He denies any chest pain. Patient was diagnosed with a left lower extremity DVT in the ED 10/21. He was started on xarelto approx. 1-2 weeks ago. He denies any melena, hematochezia. Patient denies any fever, chills, headache, vision changes, chest chest pain, abdominal pain, urinary symptoms, change in bowel habits, melena, hematochezia, numbness/tingling, recent trauma,   Still has increased pain and swelling in left leg associated with recent diagnosis of DVT.        Past Medical History:  Diagnosis Date  . Acute MI   . Anemia of chronic disease   . Diabetes mellitus   . Diabetic neuropathy ([place])   . Hyperlipidemia   . Hypertension   . Leukocytosis   . Stroke [facility])     Patient Active Problem List   Diagnosis Date Noted  . Cellulitis and abscess 12/28/2015  . Hyperlipidemia 12/28/2015  . Leukocytosis 12/28/2015  . Anemia of chronic disease 12/28/2015  . Abscess of right hand including fingers 12/28/2015  . CAP (community acquired pneumonia) 06/08/2015  . Diabetes ([place])  06/08/2015  . HTN (hypertension) 06/08/2015  . Pneumonia 06/08/2015    Past Surgical History:  Procedure Laterality Date  . EYE SURGERY         Home Medications    Prior to Admission medications   Medication Sig Start Date End Date Taking? Authorizing Provider  ALPRAZolam Duanne Moron) 0.5 MG tablet Take 0.5 mg by mouth 2 (two) times daily. 05/15/15   Historical Provider, MD  budesonide-formoterol (SYMBICORT) 80-4.5 MCG/ACT inhaler Inhale 2 puffs into the lungs 2 (two) times daily.    Historical Provider, MD  Choline Fenofibrate (FENOFIBRIC ACID) 135 MG CPDR Take 135 mg by mouth daily.  05/30/15   Historical Provider, MD  cilostazol (PLETAL) 50 MG tablet Take 100 mg by mouth 2 (two) times daily.  06/06/15   Historical Provider, MD  ezetimibe (ZETIA) 10 MG tablet Take 10 mg by mouth daily.    Historical Provider, MD  fluticasone (FLONASE) 50 MCG/ACT nasal spray Place 2 sprays into both nostrils 2 (two) times daily.  05/30/15   Historical Provider, MD  gabapentin (NEURONTIN) 300 MG capsule Take 600 mg by mouth 2 (two) times daily.  05/30/15   Historical Provider, MD  HUMALOG MIX 75/25 (75-25) 100 UNIT/ML SUSP injection Inject 30 Units into the skin 2 (two) times daily as needed (CBG >124).  03/25/15   Historical Provider, MD  meloxicam (MOBIC) 7.5 MG tablet Take 7.5 mg by mouth 2 (two) times daily. 05/14/15   Historical Provider, MD  naproxen (NAPROSYN) 375 MG tablet  Take 1 tablet (375 mg total) by mouth 2 (two) times daily. 08/31/16   Melynda Ripple, MD  oxyCODONE-acetaminophen (PERCOCET) 10-325 MG per tablet Take 1 tablet by mouth every 6 (six) hours as needed for pain.  05/31/15   Historical Provider, MD  pioglitazone-metformin (ACTOPLUS MET) 15-850 MG per tablet Take 1 tablet by mouth at bedtime.  05/14/15   Historical Provider, MD  Rivaroxaban 15 & 20 MG TBPK Take 15 mg by mouth 2 (two) times daily. Take as directed on package: Start with one 65m tablet by mouth twice a day with food. On Day 22,  switch to one 283mtablet once a day with food. 09/05/16   MaCharlesetta ShanksMD  saxagliptin HCl (ONGLYZA) 5 MG TABS tablet Take 5 mg by mouth daily.    Historical Provider, MD  simvastatin (ZOCOR) 10 MG tablet Take 10 mg by mouth at bedtime.  05/27/15   Historical Provider, MD  tamsulosin (FLOMAX) 0.4 MG CAPS capsule Take 0.4 mg by mouth daily.  06/06/15   Historical Provider, MD  tiZANidine (ZANAFLEX) 2 MG tablet Take 2 mg by mouth 2 (two) times daily.  05/29/15   Historical Provider, MD  traMADol (ULTRAM) 50 MG tablet Take 50 mg by mouth 2 (two) times daily. scheduled 05/31/15   Historical Provider, MD  valsartan-hydrochlorothiazide (DIOVAN-HCT) 160-25 MG per tablet Take 1 tablet by mouth daily.  05/27/15   Historical Provider, MD    Family History History reviewed. No pertinent family history.  Social History Social History  Substance Use Topics  . Smoking status: Current Some Day Smoker  . Smokeless tobacco: Current User  . Alcohol use No     Allergies   Codeine   Review of Systems Review of Systems  Constitutional: Negative for chills and fever.  HENT: Negative for congestion, ear pain, rhinorrhea and sore throat.   Eyes: Negative for pain and discharge.  Respiratory: Positive for shortness of breath. Negative for cough.   Cardiovascular: Negative for chest pain and palpitations.  Gastrointestinal: Negative for abdominal pain, diarrhea, nausea and vomiting.  Genitourinary: Negative for flank pain, frequency, hematuria and urgency.  Musculoskeletal: Negative for myalgias and neck pain.  Neurological: Positive for light-headedness. Negative for dizziness, syncope, weakness, numbness and headaches.  All other systems reviewed and are negative.    Physical Exam Updated Vital Signs BP 112/61   Pulse 88   Temp 98.6 F (37 C) (Oral)   Resp 17   Ht 5' 6"  (1.676 m)   Wt 96.6 kg   SpO2 98%   BMI 34.38 kg/m   Physical Exam  Constitutional: He is oriented to person, place,  and time. He appears well-developed and well-nourished. No distress.  Patient does not appear to be in acute distress. He is maintaining his airway and sitting comfortably on the bed.  HENT:  Head: Normocephalic and atraumatic.  Right Ear: Tympanic membrane, external ear and ear canal normal.  Left Ear: Tympanic membrane, external ear and ear canal normal.  Nose: Nose normal.  Mouth/Throat: Uvula is midline and oropharynx is clear and moist. Mucous membranes are pale.  Eyes: Conjunctivae are normal. Right eye exhibits no discharge. Left eye exhibits no discharge. No scleral icterus.  Neck: Normal range of motion. Neck supple. No thyromegaly present.  Cardiovascular: Normal rate, regular rhythm, normal heart sounds and intact distal pulses.  Exam reveals no gallop and no friction rub.   No murmur heard. Pulmonary/Chest: Effort normal and breath sounds normal. No respiratory distress. He has no  wheezes.  Abdominal: Soft. Bowel sounds are normal. He exhibits distension (wihout any tenderness). There is no tenderness. There is no rebound and no guarding.  Genitourinary:  Genitourinary Comments: Chaperone present for exam. Soft brown stool noted in the rectal vault. No external hemorrhoids or internal hemorrhoids noted. Rectal tone. No tenderness to palpation. Hemoccult positive.  Musculoskeletal: Normal range of motion. He exhibits edema (left leg).  Edema of left leg with ttp of the popliteal fossa. History of dvt currently on blood thinners.  Lymphadenopathy:    He has no cervical adenopathy.  Neurological: He is alert and oriented to person, place, and time.  Skin: Skin is warm and dry. Capillary refill takes less than 2 seconds. There is pallor.  Nursing note and vitals reviewed.    ED Treatments / Results  Labs (all labs ordered are listed, but only abnormal results are displayed) Labs Reviewed  BASIC METABOLIC PANEL - Abnormal; Notable for the following:       Result Value    Chloride 100 (*)    BUN 60 (*)    Creatinine, Ser 1.73 (*)    GFR calc non Af Amer 39 (*)    GFR calc Af Amer 45 (*)    All other components within normal limits  CBC - Abnormal; Notable for the following:    WBC 13.0 (*)    RBC 2.42 (*)    Hemoglobin 6.2 (*)    HCT 20.2 (*)    MCH 25.6 (*)    RDW 17.1 (*)    Platelets 740 (*)    All other components within normal limits  POC OCCULT BLOOD, ED - Abnormal; Notable for the following:    Fecal Occult Bld POSITIVE (*)    All other components within normal limits  URINALYSIS, ROUTINE W REFLEX MICROSCOPIC (NOT AT [facility])  VITAMIN B12  FOLATE  IRON AND TIBC  FERRITIN  RETICULOCYTES  PROTIME-INR  CBG MONITORING, ED  TYPE AND SCREEN  PREPARE RBC (CROSSMATCH)  ABO/RH    EKG  EKG Interpretation  Date/Time:  Tuesday September 15 2016 17:36:19 EDT Ventricular Rate:  84 PR Interval:  152 QRS Duration: 94 QT Interval:  386 QTC Calculation: 456 R Axis:   -16 Text Interpretation:  Normal sinus rhythm Normal ECG Confirmed by BEATON  MD, ROBERT ([id number]) on 09/15/2016 8:57:42 PM       Radiology Ct Abdomen Pelvis Wo Contrast  Result Date: 09/16/2016 CLINICAL DATA:  Fatigue and dyspnea.  Anemia. EXAM: CT ABDOMEN AND PELVIS WITHOUT CONTRAST TECHNIQUE: Multidetector CT imaging of the abdomen and pelvis was performed following the standard protocol without IV contrast. COMPARISON:  None. FINDINGS: Lower chest: No acute abnormality. Hepatobiliary: No focal liver abnormality is seen. No gallstones, gallbladder wall thickening, or biliary dilatation. Pancreas: Unremarkable. No pancreatic ductal dilatation or surrounding inflammatory changes. Spleen: Normal in size without focal abnormality. Adrenals/Urinary Tract: Both adrenals are normal. 2.5 cm benign cyst of the left kidney upper pole anteriorly. Collecting systems and ureters are unremarkable. Bladder is unremarkable. Stomach/Bowel: Stomach is within normal limits. Appendix appears normal. No  evidence of bowel wall thickening, distention, or inflammatory changes. Vascular/Lymphatic: Atherosclerotic calcifications of the aorta. Multiple small indeterminate retroperitoneal and mesenteric nodes, unchanged from 08/01/2013. Reproductive: Prostate is unremarkable. Other: No evidence of retroperitoneal hematoma. Incidental findings include a 5 x 8.5 cm lipoma of the right adductor musculature. This is unchanged. Musculoskeletal: No acute or significant osseous findings. IMPRESSION: 1. No acute findings are evident in the abdomen or pelvis. 2.  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 This does not require follow-up. Electronically Signed   By: Andreas Newport M.D.   On: 09/16/2016 00:07    Procedures Procedures (including critical care time)  Medications Ordered in ED Medications  pantoprazole (PROTONIX) injection 40 mg (40 mg Intravenous Given 09/15/16 2244)  0.9 %  sodium chloride infusion ( Intravenous New Bag/Given 09/15/16 2248)  acetaminophen (TYLENOL) tablet 650 mg (650 mg Oral Given 09/15/16 2247)  diphenhydrAMINE (BENADRYL) capsule 25 mg (25 mg Oral Given 09/15/16 2247)     Initial Impression / Assessment and Plan / ED Course  I have reviewed the triage vital signs and the nursing notes.  Pertinent labs & imaging results that were available during my care of the patient were reviewed by me and considered in my medical decision making (see chart for details).  Clinical Course  Patient presented to ED with anemia, shortness of breath, dizziness. The patient's hemoglobin was noted to be 6.2 in ED with symptomatic anemia. Patient with left lower exudate DVT that is currently on xarelto that he started 1 week ago. Patient had Hemoccult positive stool. Without evidence of active GI bleed. Patient with evidence of AKI with creatine of 1.73 and BUN of 60. Fluid bolus given.  2 unit of PRBC were ordered and transfused with consent obtained prior to transfusion. I have spoke  with [facility] who recommends putting patient on PPI and they will consult patient on hospital service. I spoke with Dr. Eulas Post who agrees to admit patient for blood transfusion and GI consult in the AM. Ct was performed at Dr. Eulas Post request to assess for retroperitoneal bleeding. No acute abnormalities were seen. Patient is hemodynamically stable at this time. Patient is agreeable for admission.  Patient was discussed and evaluated by DR. Audie Pinto who agrees with the above plan.    Final Clinical Impressions(s) / ED Diagnoses   Final diagnoses:  Symptomatic anemia  Gastrointestinal hemorrhage, unspecified gastrointestinal hemorrhage type  Abdominal distention  Anemia  Abdominal pain    New Prescriptions New Prescriptions   No medications on file     Doristine Devoid, PA-C 09/16/16 1310    Doristine Devoid, PA-C 09/16/16 1311    Leonard Schwartz, MD 09/26/16 2145

## 2016-09-15 NOTE — H&P (Signed)
History and Physical    Joaquim Tolen FGH:[id number] DOB: [birth year] DOA: 09/15/2016  PCP: Leola Brazil, MD   Patient coming from: Home  Chief Complaint: Weakness, shortness of breath  HPI: Gregory Walker is a 70 y.o. gentleman with a history of HTN, DM, HLD, prior CVA, chronic anemia, and recent diagnosis of LLE DVT (left posterior tibial, peroneal, and popliteal veins involved) who has been on Xarelto for almost two weeks.  He saw his PCP yesterday for evaluation of progressive fatigue, weakness, and SOB/DOE.  He has also had light-headedness but no syncope.  He denies chest pain.  He has had increased abdominal girth with mild discomfort.  Outpatient labs revealed significant anemia, and the patient was contacted and advised to report to the ED.  CBC tonight shows Hgb of 6.  The patient denies any recent trauma.  No falls.  No gross bleeding.  Specifically, he denies blood in urine or stool.  No hematemesis.  Still has increased pain and swelling in left leg associated with recent diagnosis of DVT.  ED Course: Stool heme occult POSITIVE in the ED.  Normal EKG.  He has evidence of AKI with BUN 60 and creatinine of 1.73.  Two units of PRBCs ordered.  [facility] consulted from the ED; patient to be seen in the AM.  Hospitalist asked to admit.  Review of Systems: As per HPI otherwise 10 point review of systems negative.    Past Medical History:  Diagnosis Date  . Acute MI   . Anemia of chronic disease   . Diabetes mellitus   . Diabetic neuropathy ([place])   . Hyperlipidemia   . Hypertension   . Leukocytosis   . Stroke [facility])     Past Surgical History:  Procedure Laterality Date  . EYE SURGERY       reports that he has been smoking.  He uses smokeless tobacco. He reports that he does not drink alcohol or use drugs.  Allergies  Allergen Reactions  . Codeine Itching    FAMILY HISTORY: One sister is also a diabetic. Mother is deceased and died of complications  related to bone cancer. Father is deceased and died of complications related to MI.  Prior to Admission medications   Medication Sig Start Date End Date Taking? Authorizing Provider  gabapentin (NEURONTIN) 300 MG capsule Take 300-900 mg by mouth See admin instructions. 300 mg at lunchtime then 600 mg at suppertime then 900 mg at bedtime 05/30/15  Yes Historical Provider, MD  oxyCODONE-acetaminophen (PERCOCET) 10-325 MG per tablet Take 1 tablet by mouth 5 (five) times daily.  05/31/15  Yes Historical Provider, MD  ALPRAZolam Duanne Moron) 0.5 MG tablet Take 0.5 mg by mouth 2 (two) times daily. 05/15/15   Historical Provider, MD  budesonide-formoterol (SYMBICORT) 80-4.5 MCG/ACT inhaler Inhale 2 puffs into the lungs 2 (two) times daily.    Historical Provider, MD  Choline Fenofibrate (FENOFIBRIC ACID) 135 MG CPDR Take 135 mg by mouth daily.  05/30/15   Historical Provider, MD  cilostazol (PLETAL) 50 MG tablet Take 100 mg by mouth 2 (two) times daily.  06/06/15   Historical Provider, MD  ezetimibe (ZETIA) 10 MG tablet Take 10 mg by mouth daily.    Historical Provider, MD  fluticasone (FLONASE) 50 MCG/ACT nasal spray Place 2 sprays into both nostrils 2 (two) times daily.  05/30/15   Historical Provider, MD  HUMALOG MIX 75/25 (75-25) 100 UNIT/ML SUSP injection Inject 30 Units into the skin 2 (two) times daily as needed (  CBG >124).  03/25/15   Historical Provider, MD  meloxicam (MOBIC) 7.5 MG tablet Take 7.5 mg by mouth 2 (two) times daily. 05/14/15   Historical Provider, MD  naproxen (NAPROSYN) 375 MG tablet Take 1 tablet (375 mg total) by mouth 2 (two) times daily. 08/31/16   Melynda Ripple, MD  pioglitazone-metformin (ACTOPLUS MET) [phone] MG per tablet Take 1 tablet by mouth at bedtime.  05/14/15   Historical Provider, MD  Rivaroxaban 15 & 20 MG TBPK Take 15 mg by mouth 2 (two) times daily. Take as directed on package: Start with one 72m tablet by mouth twice a day with food. On Day 22, switch to one 241mtablet  once a day with food. 09/05/16   MaCharlesetta ShanksMD  saxagliptin HCl (ONGLYZA) 5 MG TABS tablet Take 5 mg by mouth daily.    Historical Provider, MD  simvastatin (ZOCOR) 10 MG tablet Take 10 mg by mouth at bedtime.  05/27/15   Historical Provider, MD  tamsulosin (FLOMAX) 0.4 MG CAPS capsule Take 0.4 mg by mouth daily.  06/06/15   Historical Provider, MD  tiZANidine (ZANAFLEX) 2 MG tablet Take 2 mg by mouth 2 (two) times daily.  05/29/15   Historical Provider, MD  traMADol (ULTRAM) 50 MG tablet Take 50 mg by mouth 2 (two) times daily. scheduled 05/31/15   Historical Provider, MD  valsartan-hydrochlorothiazide (DIOVAN-HCT) 160-25 MG per tablet Take 1 tablet by mouth daily.  05/27/15   Historical Provider, MD    Physical Exam: Vitals:   09/15/16 2030 09/15/16 2045 09/15/16 2115 09/15/16 2145  BP: (!) 104/54 115/60 112/56 117/56  Pulse: 77 76 71 77  Resp: 16 17 18 15   Temp:      TempSrc:      SpO2: 93% 95% 98% 100%  Weight:      Height:          Constitutional: NAD, calm, comfortable, NONtoxic appearing Vitals:   09/15/16 2030 09/15/16 2045 09/15/16 2115 09/15/16 2145  BP: (!) 104/54 115/60 112/56 117/56  Pulse: 77 76 71 77  Resp: 16 17 18 15   Temp:      TempSrc:      SpO2: 93% 95% 98% 100%  Weight:      Height:       Eyes: PERRL, lids and conjunctivae normal ENMT: Mucous membranes are moist. Posterior pharynx clear of any exudate or lesions. Normal dentition.  Neck: normal appearance, supple Respiratory: clear to auscultation bilaterally, no wheezing, no crackles. Normal respiratory effort. No accessory muscle use.  Cardiovascular: Normal rate, regular rhythm.  He has a murmur.  He has bilateral lower extremity edema, but left is significantly greater than right.  2+ pedal pulses. GI: abdomen is distended/protuberant but no tenderness.  Bowel sounds are present. Musculoskeletal:  No joint deformity in upper and lower extremities. Good ROM, no contractures. Normal muscle tone.    Skin: no rashes, warm and dry Neurologic: No focal deficits. Psychiatric: Normal judgment and insight. Alert and oriented x 3. Normal mood.     Labs on Admission: I have personally reviewed following labs and imaging studies  CBC:  Recent Labs Lab 09/15/16 1731  WBC 13.0*  HGB 6.2*  HCT 20.2*  MCV 83.5  PLT 74924  Basic Metabolic Panel:  Recent Labs Lab 09/15/16 1731  NA 140  K 3.7  CL 100*  CO2 28  GLUCOSE 77  BUN 60*  CREATININE 1.73*  CALCIUM 8.9   GFR: Estimated Creatinine Clearance: 43.8 mL/min (by C-G  formula based on SCr of 1.73 mg/dL (H)).  CBG:  Recent Labs Lab 09/15/16 1747  GLUCAP 83   Anemia Panel: Pending  Urine analysis: Pending  EKG: Independently reviewed. NSR.  No acute ST segment changes.  Assessment/Plan Principal Problem:   Symptomatic anemia Active Problems:   Diabetes (HCC)   HTN (hypertension)   Anemia of chronic disease   History of CVA (cerebrovascular accident)   DVT (deep venous thrombosis) (H[facility])      Acute on chronic anemia, symptomatic.  No gross blood loss.  Positive stool heme occult. --Telemetry monitoring --Agree with transfusion of at least two units of PRBCsw --GI to see in the AM --Clear liquids now; NPO after mignight --Anemia panel pending --CT A/P wo contrast to rule out retroperitoneal hemorrhage --Xarelto ON HOLD; may need consideration for IVC filter with recent DVT and new bleeding complication --Avoid NSAIDs, other anticoagulants for now  AKI --Re-assess after volume resuscitation with blood transfusion --U/A pending --HOLD ARB, diuretic, metformin --Reduce neurontin dose now --Avoid NSAIDs --Renally dose medications as appropriate  DM --Hold long acting insulin, oral diabetes meds for now --Low dose sliding scale since he will be NPO after midnight  HTN --Relative hypotension now; holding anti-hypertensives for AKI as noted above  HLD --Continue statin, zetia for now  Chronic  pain --Neurontin, tramadol, percocet prn  Recent diagnosis of LLE DVT --Xarelto ON HOLD; may need consideration for IVC filter with new bleeding complication    DVT prophylaxis: Xarelto HELD due to symptomatic anemia Code Status: FULL Family Communication: Patient alone in the ED at time of admission Disposition Plan: Expect he will discharge home when ready. Consults called: [facility] should see the patient in the AM. Admission status: Observation, telemetry   TIME SPENT: 60 minutes   Eber Jones MD [facility] Pager [phone]  If 7PM-7AM, please contact night-coverage [email or web address] Password [email or web address]  09/15/2016, 10:33 PM

## 2016-09-15 NOTE — ED Triage Notes (Signed)
Pt here reporting he was called by his PCP who told him he has low blood count. He reports some shortness of breath and generalized weakness. Pt reports wanting to drink ice water all the time. Pt alert and oriented. No distress noted.

## 2016-09-16 DIAGNOSIS — D649 Anemia, unspecified: Secondary | ICD-10-CM | POA: Diagnosis not present

## 2016-09-16 DIAGNOSIS — D638 Anemia in other chronic diseases classified elsewhere: Secondary | ICD-10-CM | POA: Diagnosis not present

## 2016-09-16 LAB — CBC
HCT: 21.8 % — ABNORMAL LOW (ref 39.0–52.0)
HEMATOCRIT: 23.7 % — AB (ref 39.0–52.0)
HEMOGLOBIN: 6.7 g/dL — AB (ref 13.0–17.0)
HEMOGLOBIN: 7.5 g/dL — AB (ref 13.0–17.0)
MCH: 25.3 pg — AB (ref 26.0–34.0)
MCH: 26.1 pg (ref 26.0–34.0)
MCHC: 30.7 g/dL (ref 30.0–36.0)
MCHC: 31.6 g/dL (ref 30.0–36.0)
MCV: 82.3 fL (ref 78.0–100.0)
MCV: 82.6 fL (ref 78.0–100.0)
Platelets: 702 10*3/uL — ABNORMAL HIGH (ref 150–400)
Platelets: 693 10*3/uL — ABNORMAL HIGH (ref 150–400)
RBC: 2.65 MIL/uL — AB (ref 4.22–5.81)
RBC: 2.87 MIL/uL — AB (ref 4.22–5.81)
RDW: 16.6 % — ABNORMAL HIGH (ref 11.5–15.5)
RDW: 16.2 % — ABNORMAL HIGH (ref 11.5–15.5)
WBC: 12.1 10*3/uL — ABNORMAL HIGH (ref 4.0–10.5)
WBC: 11.9 10*3/uL — AB (ref 4.0–10.5)

## 2016-09-16 LAB — BASIC METABOLIC PANEL
ANION GAP: 9 (ref 5–15)
BUN: 53 mg/dL — ABNORMAL HIGH (ref 6–20)
CALCIUM: 8.9 mg/dL (ref 8.9–10.3)
CO2: 30 mmol/L (ref 22–32)
CREATININE: 1.52 mg/dL — AB (ref 0.61–1.24)
Chloride: 102 mmol/L (ref 101–111)
GFR calc Af Amer: 52 mL/min — ABNORMAL LOW (ref >60)
GFR, EST NON AFRICAN AMERICAN: 45 mL/min — AB (ref >60)
GLUCOSE: 75 mg/dL (ref 65–99)
Potassium: 3.7 mmol/L (ref 3.5–5.1)
Sodium: 141 mmol/L (ref 135–145)

## 2016-09-16 LAB — FOLATE: Folate: 11.4 ng/mL (ref >5.9)

## 2016-09-16 LAB — IRON AND TIBC
Iron: 34 ug/dL — ABNORMAL LOW (ref 45–182)
SATURATION RATIOS: 7 % — AB (ref 17.9–39.5)
TIBC: 514 ug/dL — ABNORMAL HIGH (ref 250–450)
UIBC: 480 ug/dL

## 2016-09-16 LAB — GLUCOSE, CAPILLARY
Glucose-Capillary: 68 mg/dL (ref 65–99)
Glucose-Capillary: 80 mg/dL (ref 65–99)
Glucose-Capillary: 179 mg/dL — ABNORMAL HIGH (ref 65–99)

## 2016-09-16 LAB — RETICULOCYTES
RBC.: 2.66 MIL/uL — AB (ref 4.22–5.81)
RETIC COUNT ABSOLUTE: 111.7 10*3/uL (ref 19.0–186.0)
RETIC CT PCT: 4.2 % — AB (ref 0.4–3.1)

## 2016-09-16 LAB — FERRITIN: Ferritin: 8 ng/mL — ABNORMAL LOW (ref 24–336)

## 2016-09-16 LAB — PROTIME-INR
INR: 1.28
Prothrombin Time: 16.1 seconds — ABNORMAL HIGH (ref 11.4–15.2)

## 2016-09-16 LAB — MRSA PCR SCREENING: MRSA by PCR: NEGATIVE

## 2016-09-16 LAB — VITAMIN B12: VITAMIN B 12: 179 pg/mL — AB (ref 180–914)

## 2016-09-16 MED ORDER — ACETAMINOPHEN 325 MG PO TABS: 650.0000 mg | ORAL_TABLET | Freq: Four times a day (QID) | ORAL | Status: DC | PRN

## 2016-09-16 MED ORDER — GABAPENTIN 300 MG PO CAPS: 300.0000 mg | ORAL_CAPSULE | Freq: Three times a day (TID) | ORAL | Status: DC

## 2016-09-16 MED ORDER — GABAPENTIN 600 MG PO TABS: 600.0000 mg | ORAL_TABLET | Freq: Every day | ORAL | Status: DC

## 2016-09-16 MED ORDER — OXYCODONE HCL 5 MG PO TABS: 5.0000 mg | ORAL_TABLET | ORAL | Status: DC | PRN

## 2016-09-16 MED ORDER — GABAPENTIN 300 MG PO CAPS
900.0000 mg | ORAL_CAPSULE | Freq: Every day | ORAL | Status: DC
  Administered 2016-09-16: 900 mg via ORAL
  Filled 2016-09-16: qty 3

## 2016-09-16 MED ORDER — ONDANSETRON HCL 4 MG PO TABS: 4.0000 mg | ORAL_TABLET | Freq: Four times a day (QID) | ORAL | Status: DC | PRN

## 2016-09-16 MED ORDER — OXYCODONE-ACETAMINOPHEN 5-325 MG PO TABS: 1.0000 | ORAL_TABLET | ORAL | Status: DC | PRN

## 2016-09-16 MED ORDER — TAMSULOSIN HCL 0.4 MG PO CAPS: 0.4000 mg | ORAL_CAPSULE | Freq: Every day | ORAL | Status: DC

## 2016-09-16 MED ORDER — SODIUM CHLORIDE 0.9% FLUSH: 3.0000 mL | Freq: Two times a day (BID) | INTRAVENOUS | Status: DC

## 2016-09-16 MED ORDER — OXYCODONE-ACETAMINOPHEN 10-325 MG PO TABS
1.0000 | ORAL_TABLET | ORAL | Status: DC | PRN
Start: 2016-09-16 — End: 2016-09-16

## 2016-09-16 MED ORDER — GABAPENTIN 300 MG PO CAPS: 300.0000 mg | ORAL_CAPSULE | Freq: Every day | ORAL | Status: DC

## 2016-09-16 MED ORDER — INSULIN ASPART 100 UNIT/ML ~~LOC~~ SOLN
0.0000 [IU] | Freq: Three times a day (TID) | SUBCUTANEOUS | Status: DC
  Administered 2016-09-16: 2 [IU] via SUBCUTANEOUS

## 2016-09-16 MED ORDER — TRAMADOL HCL 50 MG PO TABS
50.0000 mg | ORAL_TABLET | Freq: Two times a day (BID) | ORAL | Status: DC
  Administered 2016-09-16: 50 mg via ORAL
  Filled 2016-09-16: qty 1

## 2016-09-16 MED ORDER — MOMETASONE FURO-FORMOTEROL FUM 100-5 MCG/ACT IN AERO
2.0000 | INHALATION_SPRAY | Freq: Two times a day (BID) | RESPIRATORY_TRACT | Status: DC
  Administered 2016-09-16: 2 via RESPIRATORY_TRACT
  Filled 2016-09-16: qty 8.8

## 2016-09-16 MED ORDER — SIMVASTATIN 10 MG PO TABS
10.0000 mg | ORAL_TABLET | Freq: Every day | ORAL | Status: DC
  Administered 2016-09-16: 10 mg via ORAL
  Filled 2016-09-16: qty 1

## 2016-09-16 MED ORDER — ONDANSETRON HCL 4 MG/2ML IJ SOLN: 4.0000 mg | Freq: Four times a day (QID) | INTRAMUSCULAR | Status: DC | PRN

## 2016-09-16 MED ORDER — TIZANIDINE HCL 2 MG PO TABS
2.0000 mg | ORAL_TABLET | Freq: Two times a day (BID) | ORAL | Status: DC
  Administered 2016-09-16: 2 mg via ORAL
  Filled 2016-09-16 (×2): qty 1

## 2016-09-16 MED ORDER — ALPRAZOLAM 0.5 MG PO TABS
0.5000 mg | ORAL_TABLET | Freq: Two times a day (BID) | ORAL | Status: DC
  Administered 2016-09-16: 0.5 mg via ORAL
  Filled 2016-09-16: qty 1

## 2016-09-16 MED ORDER — ACETAMINOPHEN 650 MG RE SUPP: 650.0000 mg | Freq: Four times a day (QID) | RECTAL | Status: DC | PRN

## 2016-09-16 MED ORDER — EZETIMIBE 10 MG PO TABS: 10.0000 mg | ORAL_TABLET | Freq: Every day | ORAL | Status: DC

## 2016-09-16 NOTE — Discharge Summary (Signed)
[facility]  Gregory Walker UJW:[id number] DOB: 05/24/1946 DOA: 09/15/2016  PCP: Gregory Walker  Admit date: 09/15/2016 Discharge date: 09/16/2016  Admitted From: home Disposition:  Left against medical advice  Recommendations for Outpatient Follow-up:  Follow up with ED ASAP  Discharge Condition: guarded, left AMA CODE STATUS: Full  HPI: Per Dr. Montez Walker,  Gregory Walker is a 70 y.o. gentleman with a history of HTN, DM, HLD, prior CVA, chronic anemia, and recent diagnosis of LLE DVT (left posterior tibial, peroneal, and popliteal veins involved) who has been on Xarelto for almost two weeks.  He saw his PCP yesterday for evaluation of progressive fatigue, weakness, and SOB/DOE.  He has also had light-headedness but no syncope.  He denies chest pain.  He has had increased abdominal girth with mild discomfort.  Outpatient labs revealed significant anemia, and the patient was contacted and advised to report to the ED.  CBC tonight shows Hgb of 6. The patient denies any recent trauma.  No falls.  No gross bleeding.  Specifically, he denies blood in urine or stool.  No hematemesis. Still has increased pain and swelling in left leg associated with recent diagnosis of DVT.  ED Course: Stool heme occult POSITIVE in the ED.  Normal EKG.  He has evidence of AKI with BUN 60 and creatinine of 1.73.  Two units of PRBCs ordered.  [facility] consulted from the ED; patient to be seen in the AM.  Hospitalist asked to admit.  Hospital Course: Discharge Diagnoses:  Principal Problem:   Symptomatic anemia Active Problems:   Diabetes (HCC)   HTN (hypertension)   Anemia of chronic disease   History of CVA (cerebrovascular accident)   DVT (deep venous thrombosis) (HCC)  Acute on chronic anemia, symptomatic. No gross blood loss. Positive stool heme occult concerning for GI bleed - Patient was admitted to telemetry for GI bleed and gastroenterology was consulted. His  anticoagulation was held at admission. He received 2 units of packed red blood cells with improvement of his hemoglobin from 6.7 to 7.5. Gastroenterology was consulted and a consult was pending, however patient decided not to wait in the hospital and wanted to leave AGAINST MEDICAL ADVICE. I discussed with patient extensively at bedside, patient at this time expresses desire to leave the Hospital immidiately, patient has been warned that this is not Medically advisable at this time, and can result in Medical complications like Death and Disability, patient understands and accepts the risks involved and assumes full responsibilty of this decision. He stated that he has dogs to care for, and once he is able to arrange for some help, he'll present back to the emergency room. AKI - probably due to anemia, improved DM  HTN HLD Chronic pain Recent diagnosis of LLE DVT - will definitely need GI evaluation for bleeding, pending these may need IVC filter    Discharge Instructions Present to ED ASAP for re-evaluation    Allergies  Allergen Reactions  . Codeine Itching    Consultations:  GI  Procedures/Studies:  None   Ct Abdomen Pelvis Wo Contrast  Result Date: 09/16/2016 CLINICAL DATA:  Fatigue and dyspnea.  Anemia. EXAM: CT ABDOMEN AND PELVIS WITHOUT CONTRAST TECHNIQUE: Multidetector CT imaging of the abdomen and pelvis was performed following the standard protocol without IV contrast. COMPARISON:  None. FINDINGS: Lower chest: No acute abnormality. Hepatobiliary: No focal liver abnormality is seen. No gallstones, gallbladder wall thickening, or biliary dilatation. Pancreas: Unremarkable. No pancreatic ductal dilatation or surrounding  inflammatory changes. Spleen: Normal in size without focal abnormality. Adrenals/Urinary Tract: Both adrenals are normal. 2.5 cm benign cyst of the left kidney upper pole anteriorly. Collecting systems and ureters are unremarkable. Bladder is unremarkable.  Stomach/Bowel: Stomach is within normal limits. Appendix appears normal. No evidence of bowel wall thickening, distention, or inflammatory changes. Vascular/Lymphatic: Atherosclerotic calcifications of the aorta. Multiple small indeterminate retroperitoneal and mesenteric nodes, unchanged from 08/01/2013. Reproductive: Prostate is unremarkable. Other: No evidence of retroperitoneal hematoma. Incidental findings include a 5 x 8.5 cm lipoma of the right adductor musculature. This is unchanged. Musculoskeletal: No acute or significant osseous findings. IMPRESSION: 1. No acute findings are evident in the abdomen or pelvis. 2. 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 This does not require follow-up. Electronically Signed   By: Ellery Plunk M.D.   On: 09/16/2016 00:07   Dg Ankle Complete Left  Result Date: 08/31/2016 CLINICAL DATA:  Pain for 3 days. EXAM: LEFT ANKLE COMPLETE - 3+ VIEW COMPARISON:  None. FINDINGS: No acute fracture or dislocation. The ankle mortise is approximated. Mild osteophytosis of the distal tibia and visualized posterior midfoot. Small plantar calcaneal enthesophyte. Mild soft tissue swelling overlying the lateral malleolus. IMPRESSION: No acute fracture, dislocation or advanced arthropathy of the left ankle. Electronically Signed   By: Deatra Robinson M.D.   On: 08/31/2016 16:21     Subjective: - no complaints. Asking to go home  Discharge Exam: Vitals:   09/16/16 0530 09/16/16 0909  BP: (!) 108/58   Pulse: 78 75  Resp: 16 18  Temp: 98.3 F (36.8 C)    Vitals:   09/16/16 0205 09/16/16 0245 09/16/16 0530 09/16/16 0909  BP: (!) 124/55 (!) 109/54 (!) 108/58   Pulse: 65 66 78 75  Resp: 16 16 16 18   Temp: 98.4 F (36.9 C) 98.6 F (37 C) 98.3 F (36.8 C)   TempSrc: Oral Oral Oral   SpO2: 98% 94% 97% 94%  Weight: 94.3 kg (207 lb 12.8 oz)     Height: 5\' 6"  (1.676 m)       General: Pt is alert, awake, not in acute distress Cardiovascular: RRR, S1/S2 +, no  rubs, no gallops Respiratory: CTA bilaterally, no wheezing, no rhonchi Abdominal: Soft, NT, ND, bowel sounds positive   The results of significant diagnostics from this hospitalization (including imaging, microbiology, ancillary and laboratory) are listed below for reference.     Microbiology: Recent Results (from the past 240 hour(s))  MRSA PCR Screening     Status: None   Collection Time: 09/16/16  3:00 AM  Result Value Ref Range Status   MRSA by PCR NEGATIVE NEGATIVE Final    Comment:        The GeneXpert MRSA Assay (FDA approved for NASAL specimens only), is one component of a comprehensive MRSA colonization surveillance program. It is not intended to diagnose MRSA infection nor to guide or monitor treatment for MRSA infections.      Labs: BNP (last 3 results) No results for input(s): BNP in the last 8760 hours. Basic Metabolic Panel:  Recent Labs Lab 09/15/16 1731 09/16/16 0223  NA 140 141  K 3.7 3.7  CL 100* 102  CO2 28 30  GLUCOSE 77 75  BUN 60* 53*  CREATININE 1.73* 1.52*  CALCIUM 8.9 8.9   Liver Function Tests: No results for input(s): AST, ALT, ALKPHOS, BILITOT, PROT, ALBUMIN in the last 168 hours. No results for input(s): LIPASE, AMYLASE in the last 168 hours. No results for input(s): AMMONIA  in the last 168 hours. CBC:  Recent Labs Lab 09/15/16 1731 09/16/16 0223 09/16/16 0836  WBC 13.0* 12.1* 11.9*  HGB 6.2* 6.7* 7.5*  HCT 20.2* 21.8* 23.7*  MCV 83.5 82.3 82.6  PLT 740* 702* 693*   Cardiac Enzymes: No results for input(s): CKTOTAL, CKMB, CKMBINDEX, TROPONINI in the last 168 hours. BNP: Invalid input(s): POCBNP CBG:  Recent Labs Lab 09/15/16 1747 09/16/16 0324 09/16/16 0349 09/16/16 0626  GLUCAP 83 68 80 179*   D-Dimer No results for input(s): DDIMER in the last 72 hours. Hgb A1c No results for input(s): HGBA1C in the last 72 hours. Lipid Profile No results for input(s): CHOL, HDL, LDLCALC, TRIG, CHOLHDL, LDLDIRECT in the  last 72 hours. Thyroid function studies No results for input(s): TSH, T4TOTAL, T3FREE, THYROIDAB in the last 72 hours.  Invalid input(s): FREET3 Anemia work up  Recent Labs  09/16/16 0223  VITAMINB12 179*  FOLATE 11.4  FERRITIN 8*  TIBC 514*  IRON 34*  RETICCTPCT 4.2*   Urinalysis    Component Value Date/Time   COLORURINE YELLOW 09/15/2016 2302   APPEARANCEUR CLEAR 09/15/2016 2302   LABSPEC 1.015 09/15/2016 2302   PHURINE 7.5 09/15/2016 2302   GLUCOSEU NEGATIVE 09/15/2016 2302   HGBUR NEGATIVE 09/15/2016 2302   BILIRUBINUR NEGATIVE 09/15/2016 2302   KETONESUR NEGATIVE 09/15/2016 2302   PROTEINUR NEGATIVE 09/15/2016 2302   UROBILINOGEN 1.0 06/09/2015 1427   NITRITE NEGATIVE 09/15/2016 2302   LEUKOCYTESUR NEGATIVE 09/15/2016 2302   Sepsis Labs Invalid input(s): PROCALCITONIN,  WBC,  LACTICIDVEN Microbiology Recent Results (from the past 240 hour(s))  MRSA PCR Screening     Status: None   Collection Time: 09/16/16  3:00 AM  Result Value Ref Range Status   MRSA by PCR NEGATIVE NEGATIVE Final    Comment:        The GeneXpert MRSA Assay (FDA approved for NASAL specimens only), is one component of a comprehensive MRSA colonization surveillance program. It is not intended to diagnose MRSA infection nor to guide or monitor treatment for MRSA infections.      Time coordinating discharge: Less than 30 minutes  SIGNED:  Pamella PertGHERGHE, Majestic Molony, Walker  [facility] 09/16/2016, 1:40 PM Pager [phone]  If 7PM-7AM, please contact night-coverage [email or web address] Password [email or web address]

## 2016-09-16 NOTE — Progress Notes (Signed)
Pt left AMA. Pt stated that he had to take care of his dogs at home and could not stay at this time. Pt stated that there was nobody else that could take care of his dogs right now. The MD, this RN, and the unit director all spoke with the pt to emphasize the importance of staying in the hospital to be evaluated. The pt verbalized that he understood this, but that he could not stay. Pt's IV and telemetry box were removed. The pt signed an AMA form and left via wheelchair. Pt was accompanied by a nurse tech at discharge. Pt left with all of his belongings.  Berdine DanceLauren Moffitt BSN, RN

## 2016-09-19 LAB — TYPE AND SCREEN
ABO/RH(D): A POS
ANTIBODY SCREEN: NEGATIVE
Unit division: 0
Unit division: 0
Unit division: 0
Unit division: 0

## 2016-09-23 ENCOUNTER — Inpatient Hospital Stay (HOSPITAL_COMMUNITY)
Admission: EM | Admit: 2016-09-23 | Discharge: 2016-09-25 | DRG: 378 | Discharge status: Against Medical Advice | Payer: Medicare Other | Attending: Internal Medicine | Admitting: Internal Medicine

## 2016-09-23 ENCOUNTER — Emergency Department (HOSPITAL_COMMUNITY): Payer: Medicare Other

## 2016-09-23 ENCOUNTER — Encounter (HOSPITAL_COMMUNITY): Payer: Self-pay

## 2016-09-23 ENCOUNTER — Emergency Department (HOSPITAL_BASED_OUTPATIENT_CLINIC_OR_DEPARTMENT_OTHER): Payer: Medicare Other

## 2016-09-23 DIAGNOSIS — Z9981 Dependence on supplemental oxygen: Secondary | ICD-10-CM

## 2016-09-23 DIAGNOSIS — D649 Anemia, unspecified: Secondary | ICD-10-CM | POA: Diagnosis not present

## 2016-09-23 DIAGNOSIS — F17200 Nicotine dependence, unspecified, uncomplicated: Secondary | ICD-10-CM | POA: Diagnosis present

## 2016-09-23 DIAGNOSIS — E876 Hypokalemia: Secondary | ICD-10-CM | POA: Diagnosis present

## 2016-09-23 DIAGNOSIS — E785 Hyperlipidemia, unspecified: Secondary | ICD-10-CM | POA: Diagnosis present

## 2016-09-23 DIAGNOSIS — I2699 Other pulmonary embolism without acute cor pulmonale: Secondary | ICD-10-CM | POA: Diagnosis not present

## 2016-09-23 DIAGNOSIS — K254 Chronic or unspecified gastric ulcer with hemorrhage: Secondary | ICD-10-CM | POA: Diagnosis not present

## 2016-09-23 DIAGNOSIS — Z7901 Long term (current) use of anticoagulants: Secondary | ICD-10-CM | POA: Diagnosis not present

## 2016-09-23 DIAGNOSIS — Z794 Long term (current) use of insulin: Secondary | ICD-10-CM | POA: Diagnosis not present

## 2016-09-23 DIAGNOSIS — I259 Chronic ischemic heart disease, unspecified: Secondary | ICD-10-CM

## 2016-09-23 DIAGNOSIS — I200 Unstable angina: Secondary | ICD-10-CM

## 2016-09-23 DIAGNOSIS — K31819 Angiodysplasia of stomach and duodenum without bleeding: Secondary | ICD-10-CM | POA: Diagnosis present

## 2016-09-23 DIAGNOSIS — N179 Acute kidney failure, unspecified: Secondary | ICD-10-CM | POA: Diagnosis present

## 2016-09-23 DIAGNOSIS — Z808 Family history of malignant neoplasm of other organs or systems: Secondary | ICD-10-CM | POA: Diagnosis not present

## 2016-09-23 DIAGNOSIS — N183 Chronic kidney disease, stage 3 (moderate): Secondary | ICD-10-CM | POA: Diagnosis present

## 2016-09-23 DIAGNOSIS — Z8673 Personal history of transient ischemic attack (TIA), and cerebral infarction without residual deficits: Secondary | ICD-10-CM | POA: Diagnosis not present

## 2016-09-23 DIAGNOSIS — I350 Nonrheumatic aortic (valve) stenosis: Secondary | ICD-10-CM | POA: Diagnosis present

## 2016-09-23 DIAGNOSIS — D638 Anemia in other chronic diseases classified elsewhere: Secondary | ICD-10-CM | POA: Diagnosis present

## 2016-09-23 DIAGNOSIS — E1140 Type 2 diabetes mellitus with diabetic neuropathy, unspecified: Secondary | ICD-10-CM | POA: Diagnosis present

## 2016-09-23 DIAGNOSIS — K921 Melena: Secondary | ICD-10-CM | POA: Diagnosis present

## 2016-09-23 DIAGNOSIS — I5031 Acute diastolic (congestive) heart failure: Secondary | ICD-10-CM | POA: Diagnosis not present

## 2016-09-23 DIAGNOSIS — K5730 Diverticulosis of large intestine without perforation or abscess without bleeding: Secondary | ICD-10-CM | POA: Diagnosis present

## 2016-09-23 DIAGNOSIS — R778 Other specified abnormalities of plasma proteins: Secondary | ICD-10-CM | POA: Diagnosis present

## 2016-09-23 DIAGNOSIS — D62 Acute posthemorrhagic anemia: Secondary | ICD-10-CM | POA: Diagnosis present

## 2016-09-23 DIAGNOSIS — J449 Chronic obstructive pulmonary disease, unspecified: Secondary | ICD-10-CM | POA: Diagnosis present

## 2016-09-23 DIAGNOSIS — K922 Gastrointestinal hemorrhage, unspecified: Secondary | ICD-10-CM | POA: Diagnosis not present

## 2016-09-23 DIAGNOSIS — Z8249 Family history of ischemic heart disease and other diseases of the circulatory system: Secondary | ICD-10-CM | POA: Diagnosis not present

## 2016-09-23 DIAGNOSIS — E1122 Type 2 diabetes mellitus with diabetic chronic kidney disease: Secondary | ICD-10-CM | POA: Diagnosis present

## 2016-09-23 DIAGNOSIS — Z86718 Personal history of other venous thrombosis and embolism: Secondary | ICD-10-CM | POA: Diagnosis not present

## 2016-09-23 DIAGNOSIS — Z833 Family history of diabetes mellitus: Secondary | ICD-10-CM | POA: Diagnosis not present

## 2016-09-23 DIAGNOSIS — Z8719 Personal history of other diseases of the digestive system: Secondary | ICD-10-CM | POA: Diagnosis present

## 2016-09-23 DIAGNOSIS — I1310 Hypertensive heart and chronic kidney disease without heart failure, with stage 1 through stage 4 chronic kidney disease, or unspecified chronic kidney disease: Secondary | ICD-10-CM | POA: Diagnosis present

## 2016-09-23 DIAGNOSIS — I214 Non-ST elevation (NSTEMI) myocardial infarction: Secondary | ICD-10-CM | POA: Diagnosis not present

## 2016-09-23 LAB — COMPREHENSIVE METABOLIC PANEL
ALBUMIN: 3.2 g/dL — AB (ref 3.5–5.0)
ALT: 13 U/L — ABNORMAL LOW (ref 17–63)
ANION GAP: 11 (ref 5–15)
AST: 21 U/L (ref 15–41)
Alkaline Phosphatase: 33 U/L — ABNORMAL LOW (ref 38–126)
BUN: 56 mg/dL — ABNORMAL HIGH (ref 6–20)
CO2: 32 mmol/L (ref 22–32)
Calcium: 8.9 mg/dL (ref 8.9–10.3)
Chloride: 99 mmol/L — ABNORMAL LOW (ref 101–111)
Creatinine, Ser: 1.66 mg/dL — ABNORMAL HIGH (ref 0.61–1.24)
GFR calc Af Amer: 47 mL/min — ABNORMAL LOW (ref >60)
GFR calc non Af Amer: 40 mL/min — ABNORMAL LOW (ref >60)
GLUCOSE: 162 mg/dL — AB (ref 65–99)
Potassium: 3.6 mmol/L (ref 3.5–5.1)
SODIUM: 142 mmol/L (ref 135–145)
Total Bilirubin: 0.4 mg/dL (ref 0.3–1.2)
Total Protein: 5.9 g/dL — ABNORMAL LOW (ref 6.5–8.1)

## 2016-09-23 LAB — BRAIN NATRIURETIC PEPTIDE: B Natriuretic Peptide: 133.5 pg/mL — ABNORMAL HIGH (ref 0.0–100.0)

## 2016-09-23 LAB — I-STAT CHEM 8, ED
BUN: 56 mg/dL — AB (ref 6–20)
CALCIUM ION: 1.02 mmol/L — AB (ref 1.15–1.40)
CHLORIDE: 96 mmol/L — AB (ref 101–111)
Creatinine, Ser: 1.7 mg/dL — ABNORMAL HIGH (ref 0.61–1.24)
Glucose, Bld: 161 mg/dL — ABNORMAL HIGH (ref 65–99)
HEMATOCRIT: 20 % — AB (ref 39.0–52.0)
Hemoglobin: 6.8 g/dL — CL (ref 13.0–17.0)
Potassium: 3.6 mmol/L (ref 3.5–5.1)
Sodium: 140 mmol/L (ref 135–145)
TCO2: 34 mmol/L (ref 0–100)

## 2016-09-23 LAB — CBC
HCT: 19 % — ABNORMAL LOW (ref 39.0–52.0)
HEMOGLOBIN: 5.7 g/dL — AB (ref 13.0–17.0)
MCH: 25.2 pg — ABNORMAL LOW (ref 26.0–34.0)
MCHC: 30 g/dL (ref 30.0–36.0)
MCV: 84.1 fL (ref 78.0–100.0)
PLATELETS: 511 10*3/uL — AB (ref 150–400)
RBC: 2.26 MIL/uL — ABNORMAL LOW (ref 4.22–5.81)
RDW: 17.3 % — AB (ref 11.5–15.5)
WBC: 15 10*3/uL — ABNORMAL HIGH (ref 4.0–10.5)

## 2016-09-23 LAB — GLUCOSE, CAPILLARY: Glucose-Capillary: 89 mg/dL (ref 65–99)

## 2016-09-23 LAB — TROPONIN I: Troponin I: 7.58 ng/mL (ref <0.03)

## 2016-09-23 LAB — LIPASE, BLOOD: LIPASE: 44 U/L (ref 11–51)

## 2016-09-23 LAB — PROTIME-INR
INR: 1.19
PROTHROMBIN TIME: 15.2 s (ref 11.4–15.2)

## 2016-09-23 LAB — POC OCCULT BLOOD, ED: FECAL OCCULT BLD: POSITIVE — AB

## 2016-09-23 LAB — HEMOGLOBIN AND HEMATOCRIT, BLOOD
HEMATOCRIT: 21.1 % — AB (ref 39.0–52.0)
HEMOGLOBIN: 6.5 g/dL — AB (ref 13.0–17.0)

## 2016-09-23 LAB — I-STAT TROPONIN, ED: TROPONIN I, POC: 0.09 ng/mL — AB (ref 0.00–0.08)

## 2016-09-23 LAB — MAGNESIUM: Magnesium: 2.5 mg/dL — ABNORMAL HIGH (ref 1.7–2.4)

## 2016-09-23 LAB — CBG MONITORING, ED: GLUCOSE-CAPILLARY: 174 mg/dL — AB (ref 65–99)

## 2016-09-23 LAB — PREPARE RBC (CROSSMATCH)

## 2016-09-23 MED ORDER — NITROGLYCERIN 0.4 MG SL SUBL
0.4000 mg | SUBLINGUAL_TABLET | Freq: Once | SUBLINGUAL | Status: AC
  Administered 2016-09-23: 0.4 mg via SUBLINGUAL

## 2016-09-23 MED ORDER — NITROGLYCERIN IN D5W 200-5 MCG/ML-% IV SOLN
0.0000 ug/min | INTRAVENOUS | Status: DC
  Filled 2016-09-23: qty 250

## 2016-09-23 MED ORDER — FUROSEMIDE 10 MG/ML IJ SOLN
40.0000 mg | Freq: Once | INTRAMUSCULAR | Status: AC
  Administered 2016-09-23: 40 mg via INTRAVENOUS
  Filled 2016-09-23: qty 4

## 2016-09-23 MED ORDER — NITROGLYCERIN 0.4 MG SL SUBL
SUBLINGUAL_TABLET | SUBLINGUAL | Status: AC
  Filled 2016-09-23: qty 1

## 2016-09-23 MED ORDER — SODIUM CHLORIDE 0.9 % IV SOLN
250.0000 mL | INTRAVENOUS | Status: DC | PRN
Start: 2016-09-23 — End: 2016-09-25

## 2016-09-23 MED ORDER — IPRATROPIUM-ALBUTEROL 0.5-2.5 (3) MG/3ML IN SOLN
3.0000 mL | Freq: Four times a day (QID) | RESPIRATORY_TRACT | Status: DC
  Administered 2016-09-24: 3 mL via RESPIRATORY_TRACT
  Filled 2016-09-23: qty 3

## 2016-09-23 MED ORDER — SODIUM CHLORIDE 0.9 % IV SOLN
1.0000 g | Freq: Once | INTRAVENOUS | Status: AC
  Administered 2016-09-23: 1 g via INTRAVENOUS
  Filled 2016-09-23: qty 10

## 2016-09-23 MED ORDER — SODIUM CHLORIDE 0.9 % IV SOLN
INTRAVENOUS | Status: DC
  Administered 2016-09-23: 13:00:00 via INTRAVENOUS

## 2016-09-23 MED ORDER — ALBUTEROL SULFATE (2.5 MG/3ML) 0.083% IN NEBU: 2.5000 mg | INHALATION_SOLUTION | RESPIRATORY_TRACT | Status: DC | PRN

## 2016-09-23 MED ORDER — NITROGLYCERIN IN D5W 200-5 MCG/ML-% IV SOLN
0.0000 ug/min | Freq: Once | INTRAVENOUS | Status: AC
  Administered 2016-09-23: 5 ug/min via INTRAVENOUS
  Filled 2016-09-23: qty 250

## 2016-09-23 MED ORDER — PANTOPRAZOLE SODIUM 40 MG PO TBEC
40.0000 mg | DELAYED_RELEASE_TABLET | Freq: Every day | ORAL | Status: DC
  Administered 2016-09-23: 40 mg via ORAL
  Filled 2016-09-23: qty 1

## 2016-09-23 MED ORDER — ORAL CARE MOUTH RINSE
15.0000 mL | Freq: Two times a day (BID) | OROMUCOSAL | Status: DC
  Administered 2016-09-23: 15 mL via OROMUCOSAL

## 2016-09-23 MED ORDER — SODIUM CHLORIDE 0.9 % IV SOLN
10.0000 mL/h | Freq: Once | INTRAVENOUS | Status: AC
  Administered 2016-09-23: 10 mL/h via INTRAVENOUS

## 2016-09-23 NOTE — ED Notes (Signed)
Hospitalist bedside 

## 2016-09-23 NOTE — ED Notes (Signed)
Pt's CBG 174.  Informed Toni Amendourtney, Charity fundraiserN.

## 2016-09-23 NOTE — ED Provider Notes (Signed)
[facility] DEPT Provider Note   CSN: [id number] Arrival date & time: 09/23/16  1220     History   Chief Complaint Chief Complaint  Patient presents with  . Chest Pain    HPI Gregory Walker is a 70 y.o. male.  HPI Patient has had several days of chest discomfort. Today he began getting worse. He describes it as a burning quality and pressure in the center of his chest that he thought with his indigestion. Today he became very short of breath and weak. He has been coughing up frothy yellow brown sputum. Patient reports he has not been paying any attention to how his stool looks. He does not believe that it has been noticeably bloody. 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 He reports is usually painful and swollen. He is currently taking Xarelto. Past Medical History:  Diagnosis Date  . Acute MI   . Anemia of chronic disease   . Diabetes mellitus   . Diabetic neuropathy ([place])   . DVT (deep venous thrombosis) (HCC)    on Xarelto  . Hyperlipidemia   . Hypertension   . Leukocytosis   . Stroke [facility])     Patient Active Problem List   Diagnosis Date Noted  . GI bleed 09/23/2016  . Symptomatic anemia 09/15/2016  . History of CVA (cerebrovascular accident) 09/15/2016  . DVT (deep venous thrombosis) ([place]) 09/15/2016  . Cellulitis and abscess 12/28/2015  . Hyperlipidemia 12/28/2015  . Leukocytosis 12/28/2015  . Anemia of chronic disease 12/28/2015  . Abscess of right hand including fingers 12/28/2015  . CAP (community acquired pneumonia) 06/08/2015  . Diabetes ([place]) 06/08/2015  . HTN (hypertension) 06/08/2015  . Pneumonia 06/08/2015    Past Surgical History:  Procedure Laterality Date  . EYE SURGERY         Home Medications    Prior to Admission medications   Medication Sig Start Date End Date Taking? Authorizing Provider  ALPRAZolam Duanne Moron) 0.5 MG tablet Take 0.5 mg by mouth 2 (two) times daily. 05/15/15  Yes Historical Provider, MD    budesonide-formoterol (SYMBICORT) 80-4.5 MCG/ACT inhaler Inhale 2 puffs into the lungs 2 (two) times daily.   Yes Historical Provider, MD  Choline Fenofibrate (FENOFIBRIC ACID) 135 MG CPDR Take 135 mg by mouth daily.  05/30/15  Yes Historical Provider, MD  cilostazol (PLETAL) 50 MG tablet Take 100 mg by mouth 2 (two) times daily.  06/06/15  Yes Historical Provider, MD  ezetimibe (ZETIA) 10 MG tablet Take 10 mg by mouth daily.   Yes Historical Provider, MD  fluticasone (FLONASE) 50 MCG/ACT nasal spray Place 2 sprays into both nostrils 2 (two) times daily.  05/30/15  Yes Historical Provider, MD  gabapentin (NEURONTIN) 300 MG capsule Take 300-900 mg by mouth See admin instructions. 300 mg at lunchtime then 600 mg at suppertime then 900 mg at bedtime 05/30/15  Yes Historical Provider, MD  HUMALOG MIX 75/25 (75-25) 100 UNIT/ML SUSP injection Inject 30 Units into the skin 2 (two) times daily as needed (CBG >124).  03/25/15  Yes Historical Provider, MD  meloxicam (MOBIC) 7.5 MG tablet Take 7.5 mg by mouth 2 (two) times daily. 05/14/15  Yes Historical Provider, MD  naproxen (NAPROSYN) 375 MG tablet Take 1 tablet (375 mg total) by mouth 2 (two) times daily. 08/31/16  Yes Melynda Ripple, MD  oxyCODONE-acetaminophen (PERCOCET) 10-325 MG per tablet Take 1 tablet by mouth 5 (five) times daily.  05/31/15  Yes Historical Provider, MD  pioglitazone-metformin (ACTOPLUS MET) [phone]  MG per tablet Take 1 tablet by mouth at bedtime.  05/14/15  Yes Historical Provider, MD  Rivaroxaban 15 & 20 MG TBPK Take 15 mg by mouth 2 (two) times daily. Take as directed on package: Start with one 26m tablet by mouth twice a day with food. On Day 22, switch to one 29mtablet once a day with food. 09/05/16  Yes MaCharlesetta ShanksMD  saxagliptin HCl (ONGLYZA) 5 MG TABS tablet Take 5 mg by mouth daily.   Yes Historical Provider, MD  simvastatin (ZOCOR) 10 MG tablet Take 10 mg by mouth at bedtime.  05/27/15  Yes Historical Provider, MD  tamsulosin  (FLOMAX) 0.4 MG CAPS capsule Take 0.4 mg by mouth daily.  06/06/15  Yes Historical Provider, MD  tiZANidine (ZANAFLEX) 2 MG tablet Take 2 mg by mouth 2 (two) times daily.  05/29/15  Yes Historical Provider, MD  traMADol (ULTRAM) 50 MG tablet Take 50 mg by mouth 2 (two) times daily. scheduled 05/31/15  Yes Historical Provider, MD  valsartan-hydrochlorothiazide (DIOVAN-HCT) 160-25 MG per tablet Take 1 tablet by mouth daily.  05/27/15  Yes Historical Provider, MD    Family History Family History  Problem Relation Age of Onset  . Bone cancer Mother   . Heart disease Father   . Diabetes Sister     Social History Social History  Substance Use Topics  . Smoking status: Current Some Day Smoker  . Smokeless tobacco: Current User  . Alcohol use No     Allergies   Codeine   Review of Systems Review of Systems 10 Systems reviewed and are negative for acute change except as noted in the HPI.   Physical Exam Updated Vital Signs BP 114/73   Pulse 84   Temp 98.7 F (37.1 C) (Oral)   Resp 20   Ht 5' 6"  (1.676 m)   Wt 214 lb (97.1 kg)   SpO2 100%   BMI 34.54 kg/m   Physical Exam  Constitutional: He is oriented to person, place, and time.  Fatigued and ill-appearing. Pale, no acute respiratory distress  HENT:  Head: Normocephalic and atraumatic.  Mouth/Throat: Oropharynx is clear and moist.  Eyes: EOM are normal.  Cardiovascular: Normal rate, regular rhythm, normal heart sounds and intact distal pulses.   Pulmonary/Chest:  Mild increased work of breathing. Rales at bases and mid lung fields.  Abdominal: Soft. He exhibits no distension. There is no tenderness.  Genitourinary:  Genitourinary Comments: Some formed stool in the vault. Dark in appearance but no frank blood or cranberry-colored blood or clot.  Musculoskeletal: Normal range of motion.  Left lower extremity is swollen in moderate tenderness in the calf.  Neurological: He is alert and oriented to person, place, and time.  He exhibits normal muscle tone. Coordination normal.  Skin: Skin is warm and dry. There is pallor.  Psychiatric:  Patient is ill in appearance.     ED Treatments / Results  Labs (all labs ordered are listed, but only abnormal results are displayed) Labs Reviewed  BRAIN NATRIURETIC PEPTIDE - Abnormal; Notable for the following:       Result Value   B Natriuretic Peptide 133.5 (*)    All other components within normal limits  CBC - Abnormal; Notable for the following:    WBC 15.0 (*)    RBC 2.26 (*)    Hemoglobin 5.7 (*)    HCT 19.0 (*)    MCH 25.2 (*)    RDW 17.3 (*)    Platelets 511 (*)  All other components within normal limits  COMPREHENSIVE METABOLIC PANEL - Abnormal; Notable for the following:    Chloride 99 (*)    Glucose, Bld 162 (*)    BUN 56 (*)    Creatinine, Ser 1.66 (*)    Total Protein 5.9 (*)    Albumin 3.2 (*)    ALT 13 (*)    Alkaline Phosphatase 33 (*)    GFR calc non Af Amer 40 (*)    GFR calc Af Amer 47 (*)    All other components within normal limits  MAGNESIUM - Abnormal; Notable for the following:    Magnesium 2.5 (*)    All other components within normal limits  I-STAT CHEM 8, ED - Abnormal; Notable for the following:    Chloride 96 (*)    BUN 56 (*)    Creatinine, Ser 1.70 (*)    Glucose, Bld 161 (*)    Calcium, Ion 1.02 (*)    Hemoglobin 6.8 (*)    HCT 20.0 (*)    All other components within normal limits  I-STAT TROPOININ, ED - Abnormal; Notable for the following:    Troponin i, poc 0.09 (*)    All other components within normal limits  CBG MONITORING, ED - Abnormal; Notable for the following:    Glucose-Capillary 174 (*)    All other components within normal limits  POC OCCULT BLOOD, ED - Abnormal; Notable for the following:    Fecal Occult Bld POSITIVE (*)    All other components within normal limits  PROTIME-INR  LIPASE, BLOOD  TYPE AND SCREEN  PREPARE RBC (CROSSMATCH)    EKG  EKG Interpretation  Date/Time:  Wednesday  September 23 2016 12:23:32 EST Ventricular Rate:  108 PR Interval:    QRS Duration: 100 QT Interval:  301 QTC Calculation: 404 R Axis:   3 Text Interpretation:  Sinus tachycardia Multiple ventricular premature complexes Repol abnrm suggests ischemia, diffuse leads Confirmed by Johnney Killian, MD, Jeannie Done [id number]) on 09/23/2016 4:09:45 PM       Radiology Dg Chest Portable 1 View  Result Date: 09/23/2016 CLINICAL DATA:  70 year old male with chest pain and shortness of breath today. EXAM: PORTABLE CHEST 1 VIEW COMPARISON:  Chest x-ray 07/11/2015. FINDINGS: There is cephalization of the pulmonary vasculature and slight indistinctness of the interstitial markings suggestive of mild pulmonary edema. Lung volumes are low. No pleural effusions. Heart size is borderline enlarged. Upper mediastinal contours are within normal limits. Atherosclerosis in the thoracic aorta. IMPRESSION: 1. The appearance of the chest suggests developing congestive heart failure, as above. Electronically Signed   By: Vinnie Langton M.D.   On: 09/23/2016 12:59    Procedures Procedures (including critical care time) CRITICAL CARE Performed by: Charlesetta Shanks   Total critical care time: 45 minutes  Critical care time was exclusive of separately billable procedures and treating other patients.  Critical care was necessary to treat or prevent imminent or life-threatening deterioration.  Critical care was time spent personally by me on the following activities: development of treatment plan with patient and/or surrogate as well as nursing, discussions with consultants, evaluation of patient's response to treatment, examination of patient, obtaining history from patient or surrogate, ordering and performing treatments and interventions, ordering and review of laboratory studies, ordering and review of radiographic studies, pulse oximetry and re-evaluation of patient's condition.  Medications Ordered in ED Medications  0.9 %   sodium chloride infusion ( Intravenous Stopped 09/23/16 1424)  pantoprazole (PROTONIX) EC tablet 40 mg (40 mg  Oral Given 09/23/16 1554)  ipratropium-albuterol (DUONEB) 0.5-2.5 (3) MG/3ML nebulizer solution 3 mL (not administered)  albuterol (PROVENTIL) (2.5 MG/3ML) 0.083% nebulizer solution 2.5 mg (not administered)  0.9 %  sodium chloride infusion (0 mL/hr Intravenous Stopped 09/23/16 1424)     Initial Impression / Assessment and Plan / ED Course  I have reviewed the triage vital signs and the nursing notes.  Pertinent labs & imaging results that were available during my care of the patient were reviewed by me and considered in my medical decision making (see chart for details).  Clinical Course   Cardiology was consulted upon arrival. They performed bedside echo and at this time, patient has ischemic changes but is not a catheterization candidate. Plan will be to hold Xarelto and resuscitate. (14:15) consult was intensivist, Dr. Titus Mould. Will evaluate the patient in the emergency department.  Final Clinical Impressions(s) / ED Diagnoses   Final diagnoses:  Gastrointestinal hemorrhage, unspecified gastrointestinal hemorrhage type  Cardiac ischemia  Symptomatic anemia   Patient presents in serious condition. He was very dyspneic and hypoxic on EMS arrival. EKG shows diffuse ischemia. Patient has critical anemia with GI bleed. Cardiology had been immediately consulted and bedside echo performed to rule out any significant right heart strain that would be concerning for large PE. At this time most likely consideration is cardiac ischemia due to critical anemia. Sedation initiated in the emergency department with consultations to cardiology, intensivist and [facility] hospitalist. New Prescriptions New Prescriptions   No medications on file     Charlesetta Shanks, MD 09/23/16 1611

## 2016-09-23 NOTE — ED Notes (Signed)
Patients hear rate went up to 150 while moving around in the bed for 45 seconds, patient is now in a normal sinus rhythm 84, will continue to monitor, edp notified

## 2016-09-23 NOTE — ED Notes (Signed)
CRITICIAL TROPONIN 7.58 CALLED FROM THE LAB

## 2016-09-23 NOTE — Progress Notes (Signed)
  Echocardiogram 2D Echocardiogram has been performed.  Leta JunglingCooper, Daylan Boggess M 09/23/2016, 1:23 PM

## 2016-09-23 NOTE — ED Notes (Addendum)
Patients heart rate is now 162 lasting for 1 minute and a half, it appears to be wide complexed at first and then go into sinus tach, hospitalist notified and cardiology paged who said the patient needed to be changed to an ICU bed, ICU paged and states that if the patients heart rate goes up again to page cardiology, patient denies any symptoms at this time and his heart rate is now 85. Will continue to monitor no further orders.

## 2016-09-23 NOTE — Progress Notes (Signed)
Cristi LoronSanford Killingsworth is 70 y.o. male admitted for GI bleed. Patient is currently receiving transfusion with PRBC. Scheduled to receive 1 more unit of PRBC after completing current unit.  CXR from 11/8 with mild pulmonary edema. S/p Lasix 40 mg.   Evaluated patient at bedside. Patient denies any discomfort with breathing. O2 saturations 99% on 4L. Blood pressure stable. Faint, scattered crackles on exam in all lung fields and comfortable WOB.   Will d/c NS MIVF to try to prevent volume overload. If respiratory status worsens, would give additional Lasix dose and do trial of Bipap prn.   Marcy Sirenatherine Wallace, D.O. 09/23/2016, 8:38 PM PGY-2, [facility]

## 2016-09-23 NOTE — ED Notes (Signed)
Cardiology at bedside, Patient states his pain is now a 3/10, still on non-re breather sats are 100%. Heart rate remains at 150. Will continue to monitor.

## 2016-09-23 NOTE — ED Notes (Signed)
Cardiology at bedside.

## 2016-09-23 NOTE — ED Notes (Signed)
Patients heart rate remains at 160, the patient now has excruciating pain in his left chest stated 10/10, and his oxygen saturation is reading 88% on 6L [place].  Pt placed on non-rebreather. Cardiology paged.  Orders given for 1 SL 0.4 nitro, and to start a titrated Nitroglycerin drip. The patients oxygen is now 100%. Will continue to monitor.

## 2016-09-23 NOTE — ED Notes (Signed)
Patients heart rate is now 82, NSR. Will continue to monitor

## 2016-09-23 NOTE — Consult Note (Addendum)
[facility] Consult: 2:41 PM 09/23/2016  LOS: 0 days    Referring Provider: Dr Johnney Killian in ED  Primary Care Physician:  Leola Brazil, MD Primary Gastroenterologist:  Previously Thomasenia Sales.      Reason for Consultation:  FOBT +, iron def anemia.    HPI: Gregory Walker is a 70 y.o. male.  PMH DM 2, on insulin.  HTN.  DVT, dx 09/05/2016 and started Xarelto.  03/2008 Colonoscopy, avg risk screening.  Sigmoid tics, o/w normal.  Did not respond to call back letters 2014, 2015.   Developed weakness, worsening SOB, dizziness.  Sent to ED 09/15/16 by PMD after labs revealed anemia.  Hgb 6.2, normal MCV.  Ferritin 8.  FOBT +, brown stool.  Reported a single episode of black, loose stool but no hematochezia. Had AKI.  CT ab/pelvis unrevealing.  Med list included Naprosyn, Mobic Transfused PRBC x 2, Hgb rose to 7.5.  Left AMA before seeing GI.  He has 8 dogs and had no one to take care of them. PMD made appt for outpt GI visit 09/21/16.   He continued to take his Xarelto and also taking a pain med about 4 times a day. He can't tell me whether this is the oxycodone, Naprosyn or Mobic. Has not been on any PPI because, having left AMA, he did not have prescriptions or updated med list.  Returns to ED 11/8.  Episode exertional angina with dyspnea, weakness 11/7.  Recurred today.  He was thinking it was indigestion so was taking antacids and baking soda.  He called EMS late this morning and they told him to take 2 additional baby aspirin, so he had a total of 3 this morning. By the time EMS arrived this morning, the pain had abated  EKG with ST depression. Initial Troponin elevated.  BNP elevated.  CXR with CHF, however echocardiogram shows normal LV and RV function Cardiology on board.  Per Dr. Elmarie Shiley note there is  currently no evidence of PE or STEMI. He agrees with stopping the Xarelto, transfusing hgb to 9 or 10 and plans cardiac cath once he is stable. Chest pain resolved this morning, his shortness of breath is better. External pacer pads are in place.  Hgb is 5.7.  Platelets, coags normal.  PRBC 3 ordered, the first is transfusing currently.    Patient denies heartburn, indigestion. Doesn't have trouble swallowing. He's lost maybe 5 or so pounds since July 1. On that date his longtime girlfriend died and he admits to maybe being a bit depressed and not eating as well.  Patient doesn't drink/consume alcohol. He drank in his 101s and 62s but has never had any problems with his liver so far as he knows.    Past Medical History:  Diagnosis Date  . Acute MI   . Anemia of chronic disease   . Diabetes mellitus   . Diabetic neuropathy ([place])   . DVT (deep venous thrombosis) (HCC)    on Xarelto  . Hyperlipidemia   . Hypertension   . Leukocytosis   . Stroke (  [facility])     Past Surgical History:  Procedure Laterality Date  . EYE SURGERY      Prior to Admission medications   Medication Sig Start Date End Date Taking? Authorizing Provider  ALPRAZolam Duanne Moron) 0.5 MG tablet Take 0.5 mg by mouth 2 (two) times daily. 05/15/15   Historical Provider, MD  budesonide-formoterol (SYMBICORT) 80-4.5 MCG/ACT inhaler Inhale 2 puffs into the lungs 2 (two) times daily.    Historical Provider, MD  Choline Fenofibrate (FENOFIBRIC ACID) 135 MG CPDR Take 135 mg by mouth daily.  05/30/15   Historical Provider, MD  cilostazol (PLETAL) 50 MG tablet Take 100 mg by mouth 2 (two) times daily.  06/06/15   Historical Provider, MD  ezetimibe (ZETIA) 10 MG tablet Take 10 mg by mouth daily.    Historical Provider, MD  fluticasone (FLONASE) 50 MCG/ACT nasal spray Place 2 sprays into both nostrils 2 (two) times daily.  05/30/15   Historical Provider, MD  gabapentin (NEURONTIN) 300 MG capsule Take 300-900 mg by mouth See admin instructions.  300 mg at lunchtime then 600 mg at suppertime then 900 mg at bedtime 05/30/15   Historical Provider, MD  HUMALOG MIX 75/25 (75-25) 100 UNIT/ML SUSP injection Inject 30 Units into the skin 2 (two) times daily as needed (CBG >124).  03/25/15   Historical Provider, MD  meloxicam (MOBIC) 7.5 MG tablet Take 7.5 mg by mouth 2 (two) times daily. 05/14/15   Historical Provider, MD  naproxen (NAPROSYN) 375 MG tablet Take 1 tablet (375 mg total) by mouth 2 (two) times daily. 08/31/16   Melynda Ripple, MD  oxyCODONE-acetaminophen (PERCOCET) 10-325 MG per tablet Take 1 tablet by mouth 5 (five) times daily.  05/31/15   Historical Provider, MD  pioglitazone-metformin (ACTOPLUS MET) 15-850 MG per tablet Take 1 tablet by mouth at bedtime.  05/14/15   Historical Provider, MD  Rivaroxaban 15 & 20 MG TBPK Take 15 mg by mouth 2 (two) times daily. Take as directed on package: Start with one 100m tablet by mouth twice a day with food. On Day 22, switch to one 221mtablet once a day with food. 09/05/16   MaCharlesetta ShanksMD  saxagliptin HCl (ONGLYZA) 5 MG TABS tablet Take 5 mg by mouth daily.    Historical Provider, MD  simvastatin (ZOCOR) 10 MG tablet Take 10 mg by mouth at bedtime.  05/27/15   Historical Provider, MD  tamsulosin (FLOMAX) 0.4 MG CAPS capsule Take 0.4 mg by mouth daily.  06/06/15   Historical Provider, MD  tiZANidine (ZANAFLEX) 2 MG tablet Take 2 mg by mouth 2 (two) times daily.  05/29/15   Historical Provider, MD  traMADol (ULTRAM) 50 MG tablet Take 50 mg by mouth 2 (two) times daily. scheduled 05/31/15   Historical Provider, MD  valsartan-hydrochlorothiazide (DIOVAN-HCT) 160-25 MG per tablet Take 1 tablet by mouth daily.  05/27/15   Historical Provider, MD    Scheduled Meds:  Infusions: . sodium chloride Stopped (09/23/16 1424)   PRN Meds:    Allergies as of 09/23/2016 - Review Complete 09/23/2016  Allergen Reaction Noted  . Codeine Itching 03/29/2012    Family History  Problem Relation Age of Onset   . Bone cancer Mother   . Heart disease Father   . Diabetes Sister     Social History   Social History  . Marital status: Widowed    Spouse name: N/A  . Number of children: N/A  . Years of education: N/A   Occupational History  . Not  on file.   Social History Main Topics  . Smoking status: Current Some Day Smoker  . Smokeless tobacco: Current User  . Alcohol use No  . Drug use: No  . Sexual activity: Not on file   Other Topics Concern  . Not on file   Social History Narrative  . No narrative on file    REVIEW OF SYSTEMS: Constitutional:  Per HPI ENT:  No nose bleeds Pulm:  Per HPI.  No cough.  1/2 ppd smoker CV:  No palpitations, no LE edema.  GU:  No hematuria, no frequency GI:  No heartburn, no dysphagia. Daily bowel movements. Heme:  No unusual bleeding or bruising.   Transfusions:  Per HPI Neuro:  No headaches, no peripheral tingling or numbness.  Had trauma to the left orbit as a child but has no visual problems.  He complains of ongoing pain, swelling and numbness in the left leg where the DVT is. He is having trouble moving the toes and flexing and extending at the ankle. It is affecting his gait somewhat. Derm:  No itching, no rash or sores.  Endocrine:  No sweats or chills.  No polyuria or dysuria Immunization:  Not queried Travel:  None beyond local counties in last few months.    PHYSICAL EXAM: Vital signs in last 24 hours: Vitals:   09/23/16 1415 09/23/16 1432  BP: 110/64 111/62  Pulse: 84 87  Resp: 19 18  Temp:  98.7 F (37.1 C)   Wt Readings from Last 3 Encounters:  09/23/16 97.1 kg (214 lb)  09/16/16 94.3 kg (207 lb 12.8 oz)  09/05/16 99.8 kg (220 lb)    General: Pleasant, calm, does not look acutely ill. He is comfortable. Head:  No swelling. No signs of trauma.  Eyes:  There is a squinting asymmetry of the left eye. Patient says this is from childhood trauma. Ears:  Slightly HOH.  Nose:  No discharge, no congestion Mouth:  Tongue is  midline. Mucosa is moist and clear. Neck:  No JVD, no TMG. Lungs:  Crackles in the bases. No dyspnea. No cough. Heart:  RRR. No MRG. S1, S2 present. External pacer pads are in place. Abdomen:  Soft. Nontender. No HSM. No hernias. No bruits. Bowel sounds hypoactive..   Rectal:  Rectal digital exam deferred.  Brown stool sent to the lab and is again after PT positive.  Musc/Skeltl:  No joint contracture deformities. Extremities:  Some swelling, tenderness in the left calf. Swelling in the left foot dorsum.  Neurologic:  Patient has full strength on both upper extremities and the right lower extremity. He is unable to flex/extend the foot at the ankle joint and unable to move his toes on the left. Skin:  No rashes no sores or suspicious lesions.   Psych:  Pleasant, cooperative. Calm.  LAB RESULTS:  Recent Labs  09/23/16 1227 09/23/16 1241  WBC 15.0*  --   HGB 5.7* 6.8*  HCT 19.0* 20.0*  PLT 511*  --    BMET Lab Results  Component Value Date   NA 140 09/23/2016   NA 142 09/23/2016   NA 141 09/16/2016   K 3.6 09/23/2016   K 3.6 09/23/2016   K 3.7 09/16/2016   CL 96 (L) 09/23/2016   CL 99 (L) 09/23/2016   CL 102 09/16/2016   CO2 32 09/23/2016   CO2 30 09/16/2016   CO2 28 09/15/2016   GLUCOSE 161 (H) 09/23/2016   GLUCOSE 162 (H) 09/23/2016   GLUCOSE  75 09/16/2016   BUN 56 (H) 09/23/2016   BUN 56 (H) 09/23/2016   BUN 53 (H) 09/16/2016   CREATININE 1.70 (H) 09/23/2016   CREATININE 1.66 (H) 09/23/2016   CREATININE 1.52 (H) 09/16/2016   CALCIUM 8.9 09/23/2016   CALCIUM 8.9 09/16/2016   CALCIUM 8.9 09/15/2016   LFT  Recent Labs  09/23/16 1227  PROT 5.9*  ALBUMIN 3.2*  AST 21  ALT 13*  ALKPHOS 33*  BILITOT 0.4   PT/INR Lab Results  Component Value Date   INR 1.19 09/23/2016   INR 1.28 09/16/2016   Lipase     Component Value Date/Time   LIPASE 44 09/23/2016 1236     RADIOLOGY STUDIES: Dg Chest Portable 1 View  Result Date: 09/23/2016 CLINICAL DATA:   70 year old male with chest pain and shortness of breath today. EXAM: PORTABLE CHEST 1 VIEW COMPARISON:  Chest x-ray 07/11/2015. FINDINGS: There is cephalization of the pulmonary vasculature and slight indistinctness of the interstitial markings suggestive of mild pulmonary edema. Lung volumes are low. No pleural effusions. Heart size is borderline enlarged. Upper mediastinal contours are within normal limits. Atherosclerosis in the thoracic aorta. IMPRESSION: 1. The appearance of the chest suggests developing congestive heart failure, as above. Electronically Signed   By: Vinnie Langton M.D.   On: 09/23/2016 12:59     IMPRESSION:   *  Iron deficiency anemia.  FOBT +.  Transfused 2 units of blood 10/31. 3 additional units ordered to transfused today.  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 R/O ulcers, gastritis, AVMs, colonic neoplasia.   *  Angina.   Probable demand ischemia with mild troponin elevation and EKG changes.  Cardiology planning cardiac cath once anemia corrected.  *  DVT dx 10/21.  Started on Xarelto 10. This is to be held..    *  IDDM, type 2.    *  CKD, stage 3   PLAN:     *  Will need full endoscopic workup with colonoscopy and EGD once cardiac and DVT issues sorted out.  In meantime, added daily PPI.  Can eat solid food.    *  I informed patient that I was canceling the 10/01/16 GI appointment with Dr. Havery Moros.   Azucena Freed  09/23/2016, 2:41 PM Pager: [phone]  ________________________________________________________________________  Velora Heckler GI MD note:  I personally examined the patient, reviewed the data and agree with the assessment and plan described above.  His Hb bumped from 5.7 to 7.8 after three units was transfused.  Troponin rising 7.6 to 11.9 this morning.  As of initial evaluation yesterday afternoon he had not had any overt GI bleeding (stool was brown, Heme +) however overnight RN reports a single large black BM last night.  He is sitting  up in a chair.  Severe anemia, black stool overnight in setting of possible NSAID use without taking PPI (all per his med list, he doesn't seem to be a very reliable historian).  Evolving MI.  I will start IV PPI bolus and drip this morning.  Given still rising troponins I am not planning on EGD today.   Will follow along.  Please consider another 1-2 unit blood transfusion.   Owens Loffler, MD [facility] Pager [phone]    Addendum: Appreciate Dr. Acie Fredrickson input.  We will plan for EGD later today (looking at around 2pm, bedside in ICU).

## 2016-09-23 NOTE — Consult Note (Signed)
PULMONARY / CRITICAL CARE MEDICINE   Name: Gregory Walker MRN: [id number] DOB: 1946-08-17    ADMISSION DATE:  09/23/2016 CONSULTATION DATE:  09/23/16  REFERRING MD:  Dr. Johnney Killian  CHIEF COMPLAINT:  GIB   HISTORY OF PRESENT ILLNESS:   70 y/o M with PMH of DM with neuropathy, DVT on xarelto, HTN, HLD, CVA, 4L O2 COPD and anemia who presented to [facility] on 11/8 with reports of chest pain, indigestion, cough with pink frothy sputum, and shortness of breath.    He was recently seen on 11/1 for similar symptoms and unfortunately had to leave AMA to care for his animals at home.  During that admission, he was transfused 2 units PRBC and Hgb increased to 7.5.  He continued to take his Xarelto.  The patient also reported taking an unknown "pain medication" approximately 4x per day (NSAIDS on MAR).    He returned to the ER on 11/8 with above complaints.  Due to chest pain prior to admit, he took 4 baby aspirin at EMS instruction.  The patient reported one dark tarry stool prior to admit on Halloween.  He denies abdominal pain, nausea, vomiting, hematemesis, hematochezia, coffee ground emesis, hemoptysis, lightheadedness, syncope.  ER evaluation notable for mild elevation of troponin / BNP, sr creatinine. WBC 15, Hgb 5.7 and platelets 511.  EKG with ST depression.  CXR was concerning for CHF.  FOBT positive.  The patient was hemodynamically stable in the ER.    PCCM consulted for evaluation.   PAST MEDICAL HISTORY :  He  has a past medical history of Acute MI; Anemia of chronic disease; Diabetes mellitus; Diabetic neuropathy ([place]); DVT (deep venous thrombosis) ([place]); Hyperlipidemia; Hypertension; Leukocytosis; and Stroke ([place]).  PAST SURGICAL HISTORY: He  has a past surgical history that includes Eye surgery.  Allergies  Allergen Reactions  . Codeine Itching    No current facility-administered medications on file prior to encounter.    Current Outpatient Prescriptions on File Prior to Encounter   Medication Sig  . ALPRAZolam (XANAX) 0.5 MG tablet Take 0.5 mg by mouth 2 (two) times daily.  . budesonide-formoterol (SYMBICORT) 80-4.5 MCG/ACT inhaler Inhale 2 puffs into the lungs 2 (two) times daily.  . Choline Fenofibrate (FENOFIBRIC ACID) 135 MG CPDR Take 135 mg by mouth daily.   . cilostazol (PLETAL) 50 MG tablet Take 100 mg by mouth 2 (two) times daily.   Marland Kitchen ezetimibe (ZETIA) 10 MG tablet Take 10 mg by mouth daily.  . fluticasone (FLONASE) 50 MCG/ACT nasal spray Place 2 sprays into both nostrils 2 (two) times daily.   Marland Kitchen gabapentin (NEURONTIN) 300 MG capsule Take 300-900 mg by mouth See admin instructions. 300 mg at lunchtime then 600 mg at suppertime then 900 mg at bedtime  . HUMALOG MIX 75/25 (75-25) 100 UNIT/ML SUSP injection Inject 30 Units into the skin 2 (two) times daily as needed (CBG >124).   . meloxicam (MOBIC) 7.5 MG tablet Take 7.5 mg by mouth 2 (two) times daily.  . naproxen (NAPROSYN) 375 MG tablet Take 1 tablet (375 mg total) by mouth 2 (two) times daily.  Marland Kitchen oxyCODONE-acetaminophen (PERCOCET) 10-325 MG per tablet Take 1 tablet by mouth 5 (five) times daily.   . pioglitazone-metformin (ACTOPLUS MET) 15-850 MG per tablet Take 1 tablet by mouth at bedtime.   . Rivaroxaban 15 & 20 MG TBPK Take 15 mg by mouth 2 (two) times daily. Take as directed on package: Start with one 77m tablet by mouth twice a day with food.  On Day 22, switch to one 63m tablet once a day with food.  . saxagliptin HCl (ONGLYZA) 5 MG TABS tablet Take 5 mg by mouth daily.  . simvastatin (ZOCOR) 10 MG tablet Take 10 mg by mouth at bedtime.   . tamsulosin (FLOMAX) 0.4 MG CAPS capsule Take 0.4 mg by mouth daily.   .Marland KitchentiZANidine (ZANAFLEX) 2 MG tablet Take 2 mg by mouth 2 (two) times daily.   . traMADol (ULTRAM) 50 MG tablet Take 50 mg by mouth 2 (two) times daily. scheduled  . valsartan-hydrochlorothiazide (DIOVAN-HCT) 160-25 MG per tablet Take 1 tablet by mouth daily.     FAMILY HISTORY:  His indicated  that his mother is deceased. He indicated that his father is deceased. He indicated that the status of his sister is unknown.    SOCIAL HISTORY: He  reports that he has been smoking.  He uses smokeless tobacco. He reports that he does not drink alcohol or use drugs.  REVIEW OF SYSTEMS:  POSITIVES IN BOLD Gen: Denies fever, chills, weight change, fatigue, night sweats HEENT: Denies blurred vision, double vision, hearing loss, tinnitus, sinus congestion, rhinorrhea, sore throat, neck stiffness, dysphagia PULM: Denies shortness of breath, cough, sputum production, hemoptysis, wheezing CV: Denies chest pain, edema, orthopnea, paroxysmal nocturnal dyspnea, palpitations GI: Denies abdominal pain, nausea, vomiting, diarrhea, hematochezia, melena, constipation, change in bowel habits GU: Denies dysuria, hematuria, polyuria, oliguria, urethral discharge Endocrine: Denies hot or cold intolerance, polyuria, polyphagia or appetite change Derm: Denies rash, dry skin, scaling or peeling skin change Heme: Denies easy bruising, bleeding, bleeding gums Neuro: Denies headache, numbness, weakness, slurred speech, loss of memory or consciousness   SUBJECTIVE: Chest pain resolved.  Denies any BRBPR or melena.  No hematemesis or coffee ground emesis.  VITAL SIGNS: BP 111/62   Pulse 87   Temp 98.7 F (37.1 C) (Oral)   Resp 18   Ht 5' 6"  (1.676 m)   Wt 214 lb (97.1 kg)   SpO2 99%   BMI 34.54 kg/m   HEMODYNAMICS:    VENTILATOR SETTINGS:    INTAKE / OUTPUT: No intake/output data recorded.  PHYSICAL EXAMINATION: General:  Middle aged male, in NAD. Neuro:  A&O x 3, no focal deficits. HEENT:  [place] / AT. PERRL.  MMM. Cardiovascular:  RRR, 2/6 SEM. Lungs:  Clear bilaterally, normal respiratory effort. Abdomen:  BS x 4, abd soft / NT / ND. Musculoskeletal:  No acute deformities.  Pain in LLE (chronic after DVT per pt). Skin:  Pale, dry, warm.  LABS:  BMET  Recent Labs Lab 09/23/16 1227  09/23/16 1241  NA 142 140  K 3.6 3.6  CL 99* 96*  CO2 32  --   BUN 56* 56*  CREATININE 1.66* 1.70*  GLUCOSE 162* 161*    Electrolytes  Recent Labs Lab 09/23/16 1227  CALCIUM 8.9  MG 2.5*    CBC  Recent Labs Lab 09/23/16 1227 09/23/16 1241  WBC 15.0*  --   HGB 5.7* 6.8*  HCT 19.0* 20.0*  PLT 511*  --     Coag's  Recent Labs Lab 09/23/16 1227  INR 1.19    Sepsis Markers No results for input(s): LATICACIDVEN, PROCALCITON, O2SATVEN in the last 168 hours.  ABG No results for input(s): PHART, PCO2ART, PO2ART in the last 168 hours.  Liver Enzymes  Recent Labs Lab 09/23/16 1227  AST 21  ALT 13*  ALKPHOS 33*  BILITOT 0.4  ALBUMIN 3.2*    Cardiac Enzymes No results for input(s):  TROPONINI, PROBNP in the last 168 hours.  Glucose  Recent Labs Lab 09/23/16 1236  GLUCAP 174*    Imaging Dg Chest Portable 1 View  Result Date: 09/23/2016 CLINICAL DATA:  70 year old male with chest pain and shortness of breath today. EXAM: PORTABLE CHEST 1 VIEW COMPARISON:  Chest x-ray 07/11/2015. FINDINGS: There is cephalization of the pulmonary vasculature and slight indistinctness of the interstitial markings suggestive of mild pulmonary edema. Lung volumes are low. No pleural effusions. Heart size is borderline enlarged. Upper mediastinal contours are within normal limits. Atherosclerosis in the thoracic aorta. IMPRESSION: 1. The appearance of the chest suggests developing congestive heart failure, as above. Electronically Signed   By: Vinnie Langton M.D.   On: 09/23/2016 12:59     STUDIES:  11/08  ECHO >> LVEF 55-60%, normal wall motion, left ventricular diastolic function, PAP 40.  CULTURES:   ANTIBIOTICS:   SIGNIFICANT EVENTS: 11/8  Admit with GIB, cough/SOB, chest pain   DISCUSSION: 70 y/o M admitted 11/8 with recurrent GIB, ABL, possible COPD exacerbation.   ASSESSMENT / PLAN:  Blood Loss Anemia - suspect GI bleed of unclear etiology.    Hx DVT -  on Xarelto.  Plan: Trend CBC. Transfuse for Hgb <7%. Hold home Xarelto. May need IVC filter.  GIB of unclear etiology - ? Use of unknown pain meds/?NSAIDS on MAR. Plan: GI consulted, appreciate input. Protonix gtt.  Plan for EGD in next few days. Diet as tolerated.   Dyspnea - rule out exacerbation of COPD, likely component of anemia as well.  Pulmonary Edema.  PAH per echo - PAP 40 from echo 09/23/16. Tobacco Abuse.  Plan:   Pulmonary hygiene - IS, mobilize. O2 as needed for sats 90-95%. Hold home symbicort, transition back prior to discharge. Duoneb Q6 with Q4 PRN albuterol. Hold steroids for now.  Demand Ischemia.  Chest Pain.  CHF. Hx HTN, HLD. Plan:  Cardiac monitoring.  Trend Troponin, BNP.    Acute Kidney Injury - baseline sr cr 0.74, on metformin, ARB, NSAIDS at home. Plan:   Trend BMP / UOP.  Replace electrolytes as indicated.  Hold nephrotoxic agents.  Gentle fluids.  Rest per primary team.   FAMILY  - Updates:  Patient and family updated at bedside on plan of care.    Montey Hora, [facility] Pager: [phone]  or [phone] 09/23/2016, 4:31 PM   STAFF NOTE: I, Merrie Roof, MD FACP have personally reviewed patient's available data, including medical history, events of note, physical examination and test results as part of my evaluation. I have discussed with resident/NP and other care providers such as pharmacist, RN and RRT. In addition, I personally evaluated patient and elicited key findings of: awake, alert, nonfocal, obese, BP wnl, not tachy, blood hanging, ECG with St changes concerning for ischemia from anemia, likley this is GI bleed although nothing active noted, echo re assuring, cards assessing, unable to anticoagulate, pcxr with edema concerns, as we give PRBC may need lasix to ensure not worsen edema, would recommend, can admit to sdu on tele, transfuse to hgb 8.5 as goal, repeat pcxr  in am unable to reverse current anticoagulant, follow clinically and GI recs, will sign off  Lavon Paganini. Titus Mould, MD, [place] Pgr: [facility] 09/23/2016 4:48 PM

## 2016-09-23 NOTE — ED Notes (Signed)
Dr. Royann Shiversroitoru contacted me back and is aware of troponin, no further orders, will continue to monitor

## 2016-09-23 NOTE — ED Notes (Signed)
Echo at bedside

## 2016-09-23 NOTE — ED Notes (Signed)
INTENSIVISTS PAGED REGARDING CRITICAL TROPONIN

## 2016-09-23 NOTE — ED Notes (Signed)
Patients heart rate is now 150, intensivists at bedside and made aware. No further orders will continue to monitor.  HR lasted for 45 seconds and is now back into NSR.

## 2016-09-23 NOTE — Progress Notes (Signed)
PCCM Interval Progress Note  Called by Dr. Konrad DoloresMerrell of [facility] due to pt having a few runs of wide complex tachycardia, non-sustained and self resolving. He spoke with cardiology who wanted pt to be in ICU but asked CCM to be primary team.  Discussed with Dr. Tyson AliasFeinstein, [facility]  Have asked Dr. Konrad DoloresMerrell to have cardiology address arrhythmia.  Once over acute issues and stabilized, will transfer back out of ICU.   Gregory Walker, [place] - [facility] Pager: [phone]  or [phone] 09/23/2016, 5:12 PM

## 2016-09-23 NOTE — ED Notes (Signed)
Lunch tray ordered for patient, heart healthy diet

## 2016-09-23 NOTE — Consult Note (Signed)
CONSULT NOTE  Date: 09/23/2016               Patient Name:  Gregory Walker MRN: [id number]  DOB: 1946/09/06 Age / Sex: 70 y.o., male        PCP: Regan Lemming R Primary Cardiologist: Wyvonne Lenz            Referring Physician: Darlina Rumpf              Reason for Consult: NSTEMI, severe anemia,DVT            History of Present Illness: Patient is a 70 y.o. male with a PMHx of Hypertension, diabetes,  mellitus, COPD, hyperlipidemia, prior stroke, and chronic anemia. He was recently hospitalized for a left lower leg DVT. He was started on Xarelto.  He left the hospital AMA at that time.   He started having profound chest pain while walking to his dog kennel yesterday. The pain eased up slightly when he stopped to rest. The pain returned today and he presents via EMS.  He was found have significant ST segment depressions on his EKG. We are called to evaluate.  The patient complained of midsternal chest pain. It was associated with increased weakness and shortness of breath.  He has sensation of increased indigestion. He's been coughing up pink frothy sputum.  He's feeling much better at present. He's not having nearly as much chest pain. His breathing has improved.   Medications: Outpatient medications:  (Not in a hospital admission)  Current medications: Current Facility-Administered Medications  Medication Dose Route Frequency Provider Last Rate Last Dose  . 0.9 %  sodium chloride infusion   Intravenous Continuous Charlesetta Shanks, MD 125 mL/hr at 09/23/16 1258    . 0.9 %  sodium chloride infusion  10 mL/hr Intravenous Once Charlesetta Shanks, MD       Current Outpatient Prescriptions  Medication Sig Dispense Refill  . ALPRAZolam (XANAX) 0.5 MG tablet Take 0.5 mg by mouth 2 (two) times daily.  0  . budesonide-formoterol (SYMBICORT) 80-4.5 MCG/ACT inhaler Inhale 2 puffs into the lungs 2 (two) times daily.    . Choline Fenofibrate (FENOFIBRIC ACID) 135 MG CPDR  Take 135 mg by mouth daily.   0  . cilostazol (PLETAL) 50 MG tablet Take 100 mg by mouth 2 (two) times daily.   0  . ezetimibe (ZETIA) 10 MG tablet Take 10 mg by mouth daily.    . fluticasone (FLONASE) 50 MCG/ACT nasal spray Place 2 sprays into both nostrils 2 (two) times daily.   0  . gabapentin (NEURONTIN) 300 MG capsule Take 300-900 mg by mouth See admin instructions. 300 mg at lunchtime then 600 mg at suppertime then 900 mg at bedtime  0  . HUMALOG MIX 75/25 (75-25) 100 UNIT/ML SUSP injection Inject 30 Units into the skin 2 (two) times daily as needed (CBG >124).   0  . meloxicam (MOBIC) 7.5 MG tablet Take 7.5 mg by mouth 2 (two) times daily.  0  . naproxen (NAPROSYN) 375 MG tablet Take 1 tablet (375 mg total) by mouth 2 (two) times daily. 20 tablet 0  . oxyCODONE-acetaminophen (PERCOCET) 10-325 MG per tablet Take 1 tablet by mouth 5 (five) times daily.   0  . pioglitazone-metformin (ACTOPLUS MET) 15-850 MG per tablet Take 1 tablet by mouth at bedtime.   0  . Rivaroxaban 15 & 20 MG TBPK Take 15 mg by mouth 2 (two) times daily. Take as directed on package: Start  with one 53m tablet by mouth twice a day with food. On Day 22, switch to one 222mtablet once a day with food. 51 each 0  . saxagliptin HCl (ONGLYZA) 5 MG TABS tablet Take 5 mg by mouth daily.    . simvastatin (ZOCOR) 10 MG tablet Take 10 mg by mouth at bedtime.   0  . tamsulosin (FLOMAX) 0.4 MG CAPS capsule Take 0.4 mg by mouth daily.   0  . tiZANidine (ZANAFLEX) 2 MG tablet Take 2 mg by mouth 2 (two) times daily.   0  . traMADol (ULTRAM) 50 MG tablet Take 50 mg by mouth 2 (two) times daily. scheduled  0  . valsartan-hydrochlorothiazide (DIOVAN-HCT) 160-25 MG per tablet Take 1 tablet by mouth daily.   0     Allergies  Allergen Reactions  . Codeine Itching     Past Medical History:  Diagnosis Date  . Acute MI   . Anemia of chronic disease   . Diabetes mellitus   . Diabetic neuropathy ([facility]  . Hyperlipidemia   .  Hypertension   . Leukocytosis   . [facility]    Past Surgical History:  Procedure Laterality Date  . EYE SURGERY      Family History  Problem Relation Age of Onset  . Bone cancer Mother   . Heart disease Father   . Diabetes Sister     Social History:  reports that he has been smoking.  He uses smokeless tobacco. He reports that he does not drink alcohol or use drugs.     Review of Systems: Constitutional:  denies fever, chills, diaphoresis, appetite change and fatigue.  HEENT: denies photophobia, eye pain, redness, hearing loss, ear pain, congestion, sore throat, rhinorrhea, sneezing, neck pain, neck stiffness and tinnitus.  Respiratory: admits to SOB, DOE, cough, chest tightness, and wheezing.  Cardiovascular: admits to chest pain, palpitations and leg swelling.  Gastrointestinal: admits to nausea, vomiting, slight black tarry /  blood in stool.  Genitourinary: denies dysuria, urgency, frequency, hematuria, flank pain and difficulty urinating.  Musculoskeletal: denies  myalgias, back pain, joint swelling, arthralgias and gait problem.   Skin: denies pallor, rash and wound.  Neurological: denies dizziness, seizures, syncope, weakness, light-headedness, numbness and headaches.   Hematological: denies adenopathy, easy bruising, personal or family bleeding history.  Psychiatric/ Behavioral: denies suicidal ideation, mood changes, confusion, nervousness, sleep disturbance and agitation.    Physical Exam: BP (!) 100/48 (BP Location: Left Arm)   Pulse 95   Temp 99 F (37.2 C) (Rectal)   Resp 24   Ht 5' 6"  (1.676 m)   Wt 214 lb (97.1 kg)   SpO2 92%   BMI 34.54 kg/m   Wt Readings from Last 3 Encounters:  09/23/16 214 lb (97.1 kg)  09/16/16 207 lb 12.8 oz (94.3 kg)  09/05/16 220 lb (99.8 kg)    General: Vital signs reviewed and noted. Well-developed, well-nourished, in no acute distress; alert,   Head: Normocephalic, atraumatic, sclera anicteric,   Neck: Supple.  Negative for carotid bruits. No JVD   Lungs:  Clear bilaterally, no  wheezes, rales, or rhonchi. Breathing is normal   Heart: RRR with S1 S2. No murmurs, rubs, or gallops   Abdomen/ GI :  Soft, non-tender, non-distended with normoactive bowel sounds. No hepatomegaly. No rebound/guarding. No obvious abdominal masses   MSK: Strength and the appear normal for age.   Extremities: No clubbing or cyanosis. No edema.  Distal pedal pulses are 2+ and equal  Neurologic:  CN are grossly intact,  No obvious motor or sensory defect.  Alert and oriented X 3. Moves all extremities spontaneously.  Psych: Responds to questions appropriately with a normal affect.     Lab results: Basic Metabolic Panel:  Recent Labs Lab 09/23/16 1227 09/23/16 1241  NA 142 140  K 3.6 3.6  CL 99* 96*  CO2 32  --   GLUCOSE 162* 161*  BUN 56* 56*  CREATININE 1.66* 1.70*  CALCIUM 8.9  --   MG 2.5*  --     Liver Function Tests:  Recent Labs Lab 09/23/16 1227  AST 21  ALT 13*  ALKPHOS 33*  BILITOT 0.4  PROT 5.9*  ALBUMIN 3.2*   No results for input(s): LIPASE, AMYLASE in the last 168 hours. No results for input(s): AMMONIA in the last 168 hours.  CBC:  Recent Labs Lab 09/23/16 1227 09/23/16 1241  WBC 15.0*  --   HGB 5.7* 6.8*  HCT 19.0* 20.0*  MCV 84.1  --   PLT 511*  --     Cardiac Enzymes: No results for input(s): CKTOTAL, CKMB, CKMBINDEX, TROPONINI in the last 168 hours.  BNP: Invalid input(s): POCBNP  CBG:  Recent Labs Lab 09/23/16 1236  GLUCAP 174*    Coagulation Studies:  Recent Labs  09/23/16 1227  LABPROT 15.2  INR 1.19     Other results:  Personal review of EKG shows :  -  NSR with diffuse ST depression in the anterior lateral leads V2-V6 and inferior leads ( Ii, IIi, aVF)     Imaging: Dg Chest Portable 1 View  Result Date: 09/23/2016 CLINICAL DATA:  70 year old male with chest pain and shortness of breath today. EXAM: PORTABLE CHEST 1 VIEW COMPARISON:  Chest  x-ray 07/11/2015. FINDINGS: There is cephalization of the pulmonary vasculature and slight indistinctness of the interstitial markings suggestive of mild pulmonary edema. Lung volumes are low. No pleural effusions. Heart size is borderline enlarged. Upper mediastinal contours are within normal limits. Atherosclerosis in the thoracic aorta. IMPRESSION: 1. The appearance of the chest suggests developing congestive heart failure, as above. Electronically Signed   By: Vinnie Langton M.D.   On: 09/23/2016 12:59        Assessment & Plan:  1. Probable coronary artery disease: The patient presents with episodes of chest pain. His EKG shows diffuse ST segment depression in the inferior leads and the anterolateral leads.  This is in the setting of profound anemia.  His hemoglobin is 5.7. White blood cell count is 15,000. In addition. His creatinine is 1.7. . His stool is guaiac positive.  We performed an urgent echocardiogram and my preliminary review shows normal left ventricular systolic function without segmental wall motion abdomen. He also has normal right ventricular function and normal right ventricle size. At present there is no evidence of pulmonary embolus or evidence of a STEMI.  I best option is to resuscitate him.  He needs to be transfused up to a hemoglobin of around 9 or 10. The Xarelto will need to be stopped. He'll need to be gently hydrated for his chronic kidney disease.  Once he is stable he will likely need a cardiac cath for further evaluation. I suspect that he does indeed have significant coronary artery disease.  I think it would be a mistake to do an urgent cardiac cath on him today. He is profoundly anemic and has heme-positive stool. I think that potentially starting him on dual antiplatelet therapy and giving him strong  systemic anticoagulation in the cath lab could potentially be disastrous. He's currently not having any significant pain and his breathing is progressively  improving.  2. History of DVT: He'll need to have his Xarelto stopped. He has profound anemia and has heme-positive stools. The pulmonary critical care team will be seeing him. He may need to have an IVC filter placed. The preliminary echocardiogram results suggest that the RV is not enlarged and has normal function. There is no suggestion of a pulmonary embolus although this finding does not completely rule out pulmonary embolus.  3. Anemia: He has acute/ chronic blood loss anemia. Further management per the primary team.  4. Chronic kidney disease     Ramond Dial., MD, [facility] 09/23/2016, 1:15 PM Office - [phone] Pager [phone]

## 2016-09-23 NOTE — ED Notes (Signed)
Patients first transfusion is complete. Will hold second unit until the lasix has started to diurese the patient and is producing a large amount of urine per admitting doctor.

## 2016-09-23 NOTE — ED Triage Notes (Signed)
Pt presents to the ed with chest pain by ems, reports that he was having an indigestion feeling in his chest x 3 days with no relief from his usual meds, he signed out ama on November 1.  He is also complaining of shortness of breath and coughing up pink frothy sputum.  The patient took 4 baby aspirin pta.  He has a history of GI bleed with a hemoglobin of 6.  Per ems there were possible ekg changes, edp at bedside on arrival and cleared patient from a stemi.  He is a copd patient and oxygen was 86% on RA, placed on 4L [place] with ems, he was also hypotensive on ems arrival

## 2016-09-23 NOTE — ED Notes (Addendum)
DR SOMMER CALLED BACK FROM THE ICU AND STATED TO INFORM CARDIOLOGY ABOUT TROPONIN, CARDIOLOGY PAGED

## 2016-09-23 NOTE — ED Notes (Signed)
Pt placed on 4L [place] oxygen 98%, states that he is feeling a lot better, will continue to monitor

## 2016-09-23 NOTE — Progress Notes (Signed)
Notified about elevated troponin. The patient is currently pain-free on intravenous nitroglycerin. Options for aggressive evaluation and treatment remain limited due to anticoagulation with oral anticoagulant and what appears to be active GI bleeding. As long as he is asymptomatic and there is no evidence of frank ST segment elevation, would continue with conservative management with antianginal medications.

## 2016-09-23 NOTE — ED Notes (Signed)
The patients heart rate is 150, it started out wide complexed and went into sinus tach, patient denies any symptoms, cardiology paged and contacted me back, he reviewed the 12 LEAD and said that he needed to be given the lasix.  No further orders, will continue to monitor.

## 2016-09-24 ENCOUNTER — Encounter (HOSPITAL_COMMUNITY): Payer: Self-pay

## 2016-09-24 ENCOUNTER — Encounter (HOSPITAL_COMMUNITY)
Admission: EM | Discharge status: Against Medical Advice | Payer: Self-pay | Source: Home / Self Care | Attending: Internal Medicine

## 2016-09-24 DIAGNOSIS — K254 Chronic or unspecified gastric ulcer with hemorrhage: Secondary | ICD-10-CM

## 2016-09-24 DIAGNOSIS — D62 Acute posthemorrhagic anemia: Secondary | ICD-10-CM

## 2016-09-24 DIAGNOSIS — I5031 Acute diastolic (congestive) heart failure: Secondary | ICD-10-CM

## 2016-09-24 DIAGNOSIS — I214 Non-ST elevation (NSTEMI) myocardial infarction: Secondary | ICD-10-CM

## 2016-09-24 DIAGNOSIS — K921 Melena: Principal | ICD-10-CM

## 2016-09-24 HISTORY — PX: ESOPHAGOGASTRODUODENOSCOPY: SHX5428

## 2016-09-24 LAB — PREPARE RBC (CROSSMATCH)

## 2016-09-24 LAB — MAGNESIUM: Magnesium: 2.2 mg/dL (ref 1.7–2.4)

## 2016-09-24 LAB — HEMOGLOBIN AND HEMATOCRIT, BLOOD
HCT: 23.8 % — ABNORMAL LOW (ref 39.0–52.0)
HCT: 29.5 % — ABNORMAL LOW (ref 39.0–52.0)
HEMATOCRIT: 24.2 % — AB (ref 39.0–52.0)
HEMOGLOBIN: 7.8 g/dL — AB (ref 13.0–17.0)
HEMOGLOBIN: 7.6 g/dL — AB (ref 13.0–17.0)
HEMOGLOBIN: 9.3 g/dL — AB (ref 13.0–17.0)

## 2016-09-24 LAB — MRSA PCR SCREENING: MRSA by PCR: NEGATIVE

## 2016-09-24 LAB — PROTIME-INR
INR: 1.04
Prothrombin Time: 13.6 seconds (ref 11.4–15.2)

## 2016-09-24 LAB — BASIC METABOLIC PANEL
ANION GAP: 10 (ref 5–15)
Anion gap: 10 (ref 5–15)
BUN: 36 mg/dL — AB (ref 6–20)
BUN: 19 mg/dL (ref 6–20)
CALCIUM: 9 mg/dL (ref 8.9–10.3)
CHLORIDE: 103 mmol/L (ref 101–111)
CHLORIDE: 109 mmol/L (ref 101–111)
CO2: 26 mmol/L (ref 22–32)
CO2: 23 mmol/L (ref 22–32)
CREATININE: 0.96 mg/dL (ref 0.61–1.24)
Calcium: 8.8 mg/dL — ABNORMAL LOW (ref 8.9–10.3)
Creatinine, Ser: 1.27 mg/dL — ABNORMAL HIGH (ref 0.61–1.24)
GFR calc Af Amer: >60 mL/min (ref >60)
GFR calc Af Amer: >60 mL/min (ref >60)
GFR, EST NON AFRICAN AMERICAN: 56 mL/min — AB (ref >60)
GLUCOSE: 150 mg/dL — AB (ref 65–99)
GLUCOSE: 142 mg/dL — AB (ref 65–99)
POTASSIUM: 3.1 mmol/L — AB (ref 3.5–5.1)
Potassium: 3.4 mmol/L — ABNORMAL LOW (ref 3.5–5.1)
SODIUM: 142 mmol/L (ref 135–145)
Sodium: 139 mmol/L (ref 135–145)

## 2016-09-24 LAB — TROPONIN I
Troponin I: 11.89 ng/mL (ref <0.03)
Troponin I: 8.85 ng/mL (ref <0.03)
Troponin I: 6.18 ng/mL (ref <0.03)

## 2016-09-24 LAB — PHOSPHORUS: PHOSPHORUS: 2.6 mg/dL (ref 2.5–4.6)

## 2016-09-24 LAB — LIPID PANEL
CHOL/HDL RATIO: 3.9 ratio
CHOLESTEROL: 120 mg/dL (ref 0–200)
HDL: 31 mg/dL — ABNORMAL LOW (ref >40)
LDL CALC: 69 mg/dL (ref 0–99)
Triglycerides: 102 mg/dL (ref <150)
VLDL: 20 mg/dL (ref 0–40)

## 2016-09-24 LAB — GLUCOSE, CAPILLARY: Glucose-Capillary: 185 mg/dL — ABNORMAL HIGH (ref 65–99)

## 2016-09-24 SURGERY — EGD (ESOPHAGOGASTRODUODENOSCOPY)
Anesthesia: Moderate Sedation

## 2016-09-24 MED ORDER — POTASSIUM CHLORIDE CRYS ER 20 MEQ PO TBCR
60.0000 meq | EXTENDED_RELEASE_TABLET | Freq: Once | ORAL | Status: AC
  Administered 2016-09-24: 60 meq via ORAL
  Filled 2016-09-24: qty 3

## 2016-09-24 MED ORDER — FUROSEMIDE 10 MG/ML IJ SOLN
20.0000 mg | Freq: Once | INTRAMUSCULAR | Status: AC
  Administered 2016-09-24: 20 mg via INTRAVENOUS
  Filled 2016-09-24: qty 2

## 2016-09-24 MED ORDER — SODIUM CHLORIDE 0.9 % IV SOLN
Freq: Once | INTRAVENOUS | Status: AC
  Administered 2016-09-24: 11:00:00 via INTRAVENOUS

## 2016-09-24 MED ORDER — SODIUM CHLORIDE 0.9 % IV SOLN: INTRAVENOUS | Status: DC

## 2016-09-24 MED ORDER — DIPHENHYDRAMINE HCL 50 MG/ML IJ SOLN
INTRAMUSCULAR | Status: AC
  Filled 2016-09-24: qty 1

## 2016-09-24 MED ORDER — MOMETASONE FURO-FORMOTEROL FUM 100-5 MCG/ACT IN AERO
2.0000 | INHALATION_SPRAY | Freq: Two times a day (BID) | RESPIRATORY_TRACT | Status: DC
  Administered 2016-09-25: 2 via RESPIRATORY_TRACT
  Filled 2016-09-24 (×2): qty 8.8

## 2016-09-24 MED ORDER — MIDAZOLAM HCL 10 MG/2ML IJ SOLN
INTRAMUSCULAR | Status: DC | PRN
  Administered 2016-09-24 (×2): 2 mg via INTRAVENOUS
  Administered 2016-09-24: 1 mg via INTRAVENOUS
  Administered 2016-09-24 (×2): 2 mg via INTRAVENOUS

## 2016-09-24 MED ORDER — ALPRAZOLAM 0.5 MG PO TABS: 0.5000 mg | ORAL_TABLET | Freq: Every evening | ORAL | Status: DC | PRN

## 2016-09-24 MED ORDER — SODIUM CHLORIDE 0.9 % IV SOLN
8.0000 mg/h | INTRAVENOUS | Status: DC
  Administered 2016-09-24: 8 mg/h via INTRAVENOUS
  Filled 2016-09-24 (×4): qty 80

## 2016-09-24 MED ORDER — PEG-KCL-NACL-NASULF-NA ASC-C 100 G PO SOLR: 1.0000 | Freq: Once | ORAL | Status: DC

## 2016-09-24 MED ORDER — PEG-KCL-NACL-NASULF-NA ASC-C 100 G PO SOLR
0.5000 | Freq: Once | ORAL | Status: AC
  Administered 2016-09-25: 100 g via ORAL

## 2016-09-24 MED ORDER — MIDAZOLAM HCL 5 MG/ML IJ SOLN
INTRAMUSCULAR | Status: AC
  Filled 2016-09-24: qty 2

## 2016-09-24 MED ORDER — PANTOPRAZOLE SODIUM 40 MG IV SOLR: 40.0000 mg | Freq: Two times a day (BID) | INTRAVENOUS | Status: DC

## 2016-09-24 MED ORDER — FENTANYL CITRATE (PF) 100 MCG/2ML IJ SOLN
INTRAMUSCULAR | Status: AC
  Filled 2016-09-24: qty 2

## 2016-09-24 MED ORDER — PEG-KCL-NACL-NASULF-NA ASC-C 100 G PO SOLR
0.5000 | Freq: Once | ORAL | Status: AC
  Administered 2016-09-24: 100 g via ORAL
  Filled 2016-09-24: qty 1

## 2016-09-24 MED ORDER — PANTOPRAZOLE SODIUM 40 MG PO TBEC
40.0000 mg | DELAYED_RELEASE_TABLET | Freq: Every day | ORAL | Status: DC
  Administered 2016-09-25: 40 mg via ORAL
  Filled 2016-09-24: qty 1

## 2016-09-24 MED ORDER — IPRATROPIUM-ALBUTEROL 0.5-2.5 (3) MG/3ML IN SOLN: 3.0000 mL | RESPIRATORY_TRACT | Status: DC | PRN

## 2016-09-24 MED ORDER — FENTANYL CITRATE (PF) 100 MCG/2ML IJ SOLN
INTRAMUSCULAR | Status: DC | PRN
  Administered 2016-09-24 (×4): 25 ug via INTRAVENOUS

## 2016-09-24 MED ORDER — SODIUM CHLORIDE 0.9 % IV SOLN
80.0000 mg | Freq: Once | INTRAVENOUS | Status: AC
  Administered 2016-09-24: 80 mg via INTRAVENOUS
  Filled 2016-09-24: qty 80

## 2016-09-24 MED ORDER — BUTAMBEN-TETRACAINE-BENZOCAINE 2-2-14 % EX AERO
INHALATION_SPRAY | CUTANEOUS | Status: DC | PRN
Start: 2016-09-24 — End: 2016-09-24
  Administered 2016-09-24: 2 via TOPICAL

## 2016-09-24 NOTE — Care Management Note (Signed)
  Case Management Note  Patient Details  Name: Gregory Walker MRN: [id number] Date of Birth: 11/18/1945  Subjective/Objective:  Pt admitted with GI hemorrhage                  Action/Plan:  PTA from home independent.  CM will continue to monitor for discharge needs   Expected Discharge Date:                  Expected Discharge Plan:  Home/Self Care  In-House Referral:     Discharge planning Services  CM Consult  Post Acute Care Choice:    Choice offered to:     DME Arranged:    DME Agency:     HH Arranged:    HH Agency:     Status of Service:  In process, will continue to follow  If discussed at Long Length of Stay Meetings, dates discussed:    Additional Comments:  Gregory Walker, Gregory Weil S, RN 09/24/2016, 9:51 AM

## 2016-09-24 NOTE — Progress Notes (Addendum)
Patient Name: Gregory LoronSanford Guymon Date of Encounter: 09/24/2016  [facility]   Hospital Problem List     Active Problems:   Gastrointestinal hemorrhage   Cardiac ischemia     Subjective   Pt admitted with CP / pressure while walking to dog kennels yesterday Found to have profound anemia and heme + stools. Developed respiratory distress.  Hb is up from 5.7 on admission to 7.8 after transfusions. Troponin is 11.89.  His CP has resolved.  ECG changes have resolved.  Has diuresed 1.8 liters  Inpatient Medications    Scheduled Meds: . ipratropium-albuterol  3 mL Nebulization Q6H  . mouth rinse  15 mL Mouth Rinse BID  . pantoprazole (PROTONIX) IVPB  80 mg Intravenous Once  . [START ON 09/27/2016] pantoprazole  40 mg Intravenous Q12H   Continuous Infusions: . nitroGLYCERIN 35 mcg/min (09/24/16 0305)  . pantoprozole (PROTONIX) infusion     PRN Meds: sodium chloride, albuterol   Vital Signs    Vitals:   09/24/16 0545 09/24/16 0600 09/24/16 0630 09/24/16 0700  BP: 114/62 115/62 109/65 112/78  Pulse: 72 73 76 74  Resp: (!) 24 (!) 24 18 17   Temp:      TempSrc:      SpO2: 98% 98% 99% 98%  Weight:      Height:        Intake/Output Summary (Last 24 hours) at 09/24/16 0739 Last data filed at 09/24/16 0700  Gross per 24 hour  Intake          2887.04 ml  Output             4725 ml  Net         -1837.96 ml   Filed Weights   09/23/16 1243 09/24/16 0300  Weight: 214 lb (97.1 kg) 206 lb 12.7 oz (93.8 kg)    Physical Exam    GEN: Well nourished, well developed, in no acute distress.  HEENT: Grossly normal.  Neck: Supple, no JVD, carotid bruits, or masses. Cardiac: RRR, 2/6 systolic murmur , no  rubs, or gallops. No  , edema.  Radials/DP/PT 2+ and equal bilaterally.  Respiratory:  Bilateral rales in bases . GI: Soft, nontender, nondistended, BS + x 4. MS: no deformity or atrophy. Skin: warm and dry, no rash. Neuro:  Strength and sensation  are intact. Psych: AAOx3.  Normal affect.  Labs    CBC  Recent Labs  09/23/16 1227  09/23/16 1735 09/24/16 0125  WBC 15.0*  --   --   --   HGB 5.7*  < > 6.5* 7.8*  HCT 19.0*  < > 21.1* 24.2*  MCV 84.1  --   --   --   PLT 511*  --   --   --   < > = values in this interval not displayed. Basic Metabolic Panel  Recent Labs  09/23/16 1227 09/23/16 1241 09/24/16 0125  NA 142 140 139  K 3.6 3.6 3.1*  CL 99* 96* 103  CO2 32  --  26  GLUCOSE 162* 161* 150*  BUN 56* 56* 36*  CREATININE 1.66* 1.70* 1.27*  CALCIUM 8.9  --  8.8*  MG 2.5*  --  2.2  PHOS  --   --  2.6   Liver Function Tests  Recent Labs  09/23/16 1227  AST 21  ALT 13*  ALKPHOS 33*  BILITOT 0.4  PROT 5.9*  ALBUMIN 3.2*    Recent Labs  09/23/16 1236  LIPASE 44  Cardiac Enzymes  Recent Labs  09/23/16 1735 09/24/16 0125  TROPONINI 7.58* 11.89*   BNP Invalid input(s): POCBNP D-Dimer No results for input(s): DDIMER in the last 72 hours. Hemoglobin A1C No results for input(s): HGBA1C in the last 72 hours. Fasting Lipid Panel No results for input(s): CHOL, HDL, LDLCALC, TRIG, CHOLHDL, LDLDIRECT in the last 72 hours. Thyroid Function Tests No results for input(s): TSH, T4TOTAL, T3FREE, THYROIDAB in the last 72 hours.  Invalid input(s): FREET3  Telemetry    NSR  - Personally Reviewed  ECG    NSR ,   - Personally Reviewed  Radiology    Dg Chest Portable 1 View  Result Date: 09/23/2016 CLINICAL DATA:  70 year old male with chest pain and shortness of breath today. EXAM: PORTABLE CHEST 1 VIEW COMPARISON:  Chest x-ray 07/11/2015. FINDINGS: There is cephalization of the pulmonary vasculature and slight indistinctness of the interstitial markings suggestive of mild pulmonary edema. Lung volumes are low. No pleural effusions. Heart size is borderline enlarged. Upper mediastinal contours are within normal limits. Atherosclerosis in the thoracic aorta. IMPRESSION: 1. The appearance of the chest  suggests developing congestive heart failure, as above. Electronically Signed   By: Trudie Reedaniel  Entrikin M.D.   On: 09/23/2016 12:59    Cardiac Studies    Patient Profile       Assessment & Plan    1. CAD:   Pt likely has CAD given his ST depression and CP but he is very stable at this point.   troponins are 11.89. He is much better now that he has been transfused and is on NTG. This NSTEMI is due to demand ischemia and severe anemia     He needs to have the etiology of his GI bleed evaluated and treated before we can cath and potentially place stents ( which would obligate him to a year of dual antiplatelet therapy .  He is currently stable and is at acceptable risk for EGD today .   2. Acute diastolic dysfunction:   Better after diuresis and correction of his anemia .   3. Aortic stenosis - mild ,    Signed, Kristeen MissPhilip Daphene Chisholm, MD  09/24/2016, 7:39 AM

## 2016-09-24 NOTE — Op Note (Signed)
[facility] Patient Name: Gregory LoronSanford Walker Procedure Date : 09/24/2016 MRN: [id number] Attending MD: Rachael Feeaniel P Jacobs , MD Date of Birth: 03/04/1946 CSN: [id number] Age: [age] Admit Type: Inpatient Procedure:                Upper GI endoscopy Indications:              Iron deficiency anemia, Heme positive stool, Melena Providers:                Rachael Feeaniel P. Jacobs, MD, Michel BickersLisa R. Straughn, RN, Clearnce SorrelKatie                            Smith, Technician Referring MD:              Medicines:                Fentanyl 100 micrograms IV, Midazolam 9 mg IV Complications:            No immediate complications. Estimated blood loss:                            None. Estimated Blood Loss:     Estimated blood loss: none. Procedure:                Pre-Anesthesia Assessment:                           - Prior to the procedure, a History and Physical                            was performed, and patient medications and                            allergies were reviewed. The patient's tolerance of                            previous anesthesia was also reviewed. The risks                            and benefits of the procedure and the sedation                            options and risks were discussed with the patient.                            All questions were answered, and informed consent                            was obtained. Prior Anticoagulants: The patient has                            taken no previous anticoagulant or antiplatelet                            agents. ASA Grade Assessment: III - A patient with  severe systemic disease. After reviewing the risks                            and benefits, the patient was deemed in                            satisfactory condition to undergo the procedure.                           After obtaining informed consent, the endoscope was                            passed under direct vision. Throughout the          procedure, the patient's blood pressure, pulse, and                            oxygen saturations were monitored continuously. The                            [id number] ([id number]) scope was introduced through the                            mouth, and advanced to the second part of duodenum.                            The upper GI endoscopy was accomplished without                            difficulty. The patient tolerated the procedure                            well. Scope In: Scope Out: Findings:      The esophagus was normal.      There was a small amount of fresh red blood clot in the proximal       stomach. Despite lavage, extensive inspection I could not find an       obvious site of bleeding. There were three small (2 mm) non bleeding       angiodysplastic lesions along the lesser curvature in the gastric body.       APC was not available in this "travel case" in ICU and so I lightly       touched each of the AVMs with 7Fr Gold probe cautery. This resulted in       nice blanced tissue destruction without bleeding.      The examined duodenum was normal. Impression:               - Normal esophagus.                           - Small amount of red blood clot in proximal                            stomach. 3 small gastric AVMs, non were actively  bleeding. I treated all of them with gold probe                            cautery. It is not clear that these are the source                            of his very significant anemia, only recent dark                            stools.                           - Normal examined duodenum.                           - No specimens collected. Moderate Sedation:      Moderate (conscious) sedation was administered by the endoscopy nurse       and supervised by the endoscopist. The following parameters were       monitored: oxygen saturation, heart rate, blood pressure, and response       to care. Total physician  intraservice time was 15 minutes. Recommendation:           - Remina in ICU for ongoing care.                           - Clear liquids.                           - I communicated with cardiology and we agree that                            we should proceed with colonoscopy tomorrow. We                            will arrange for that. Procedure Code(s):        --- Professional ---                           [phone], Esophagogastroduodenoscopy, flexible,                            transoral; with control of bleeding, any method Diagnosis Code(s):        --- Professional ---                           K31.819, Angiodysplasia of stomach and duodenum                            without bleeding                           D50.9, Iron deficiency anemia, unspecified                           R19.5, Other fecal abnormalities  K92.1, Melena (includes Hematochezia) CPT copyright 2016 [employer]. All rights reserved. The codes documented in this report are preliminary and upon coder review may  be revised to meet current compliance requirements. Rachael Feeaniel P Jacobs, MD 09/24/2016 2:55:42 PM This report has been signed electronically. Number of Addenda: 0

## 2016-09-24 NOTE — Progress Notes (Signed)
eLink Physician-Brief Progress Note Patient Name: Gregory LoronSanford Minichiello DOB: 10/01/1946 MRN: [id number]   Date of Service  09/24/2016  HPI/Events of Note  Multiple issues: 1. K+ = 3.1 and Creatinine = 1.27 and 2. Troponin = 7.58 >> 11.89. Cardiology has seen the patient in consultation and feels that active intervention at this time is limited by anticoagulation and GI bleeding.   eICU Interventions  Will order: 1. Replace K+.  2. Will ask beside nurse to notify cardiology of Troponin result. Doubt that result will change their management.       Intervention Category Intermediate Interventions: Diagnostic test evaluation;Electrolyte abnormality - evaluation and management  [name] 09/24/2016, 2:47 AM

## 2016-09-24 NOTE — Progress Notes (Signed)
PULMONARY / CRITICAL CARE MEDICINE   Name: Gregory Walker MRN: [id number] DOB: 07/30/1946    ADMISSION DATE:  09/23/2016 CONSULTATION DATE:  09/23/16  REFERRING MD:  Dr. Donnald GarrePfeiffer  CHIEF COMPLAINT:  GIB   Brief: 70 y/o M with PMH of DM with neuropathy, DVT on xarelto, HTN, HLD, CVA, 4L O2 COPD and anemia who [facility] with reports of chest pain, indigestion, cough with pink frothy sputum, and shortness of breath.    He was recently seen on 11/1 for similar symptoms and unfortunately had to leave AMA to care for his animals at home.  During that admission, he was transfused 2 units PRBC and Hgb increased to 7.5.  He continued to take his Xarelto.  The patient also reported taking an unknown "pain medication" approximately 4x per day (NSAIDS on MAR).    He returned to the ER on 11/8 with above complaints.  Due to chest pain prior to admit, he took 4 baby aspirin at EMS instruction.  The patient reported one dark tarry stool prior to admit on Halloween.  He denies abdominal pain, nausea, vomiting, hematemesis, hematochezia, coffee ground emesis, hemoptysis, lightheadedness, syncope.  ER evaluation notable for mild elevation of troponin / BNP, sr creatinine. WBC 15, Hgb 5.7 and platelets 511.  EKG with ST depression.  CXR was concerning for CHF.  FOBT positive.  The patient was hemodynamically stable in the ER.    PCCM consulted for evaluation.   Studies: 09/23/16:  CXR: pulmonary congestion  Troponin: 7.58>>11.89. Card aware. Limited intervention due to bleeding  EKG: Sinus tachy with QTc prologation>>NSR with mild ST depression in precordial leads throughout  BNP 135  Hgb: 5.7>>3units>>7.8  Echo: EF 55% to 60%, severe LAE, mildly dilated LV, mod focal basal hypertrophy of the septum. No motion abnormalities. PAPP 40.    SUBJECTIVE:  Admited with anemia of GIB, cough/SOB, chest pain. NSTEMI. Transfused 3 units of blood. Denies chest pain or shortness of breath. Reports  dark stool once last night. Reports left ankle pain. He had DVT in left leg.   VITAL SIGNS: BP 109/65   Pulse 76   Temp 98.6 F (37 C) (Oral)   Resp 18   Ht 5\' 6"  (1.676 m)   Wt 206 lb 12.7 oz (93.8 kg)   SpO2 99%   BMI 33.38 kg/m   HEMODYNAMICS:  HDS. Not on pressors  VENTILATOR SETTINGS:  On 1L by [place]  INTAKE / OUTPUT: I/O last 3 completed shifts: In: 2125 [I.V.:1355; Blood:670; IV Piggyback:100] Out: 2300 [Urine:2300]  PHYSICAL EXAMINATION: General:  Middle aged male, in NAD. Siting in chair.  Neuro:  Alert, awake and oriented appropriately, no focal deficits. HEENT:  Onycha on left face.  Pallor of conjuctivae Cardiovascular:  RRR, 2/6 SEM.  Lungs:  Bibasilar crackles bilaterally, normal respiratory effort. Abdomen:  BS x 4, abd soft / NT / ND. Musculoskeletal:  No acute deformities.  Pain in LLE (chronic after DVT per pt). Skin:  Pale, dry, warm. No apparent erythema.  LABS:  BMET  Recent Labs Lab 09/23/16 1227 09/23/16 1241 09/24/16 0125  NA 142 140 139  K 3.6 3.6 3.1*  CL 99* 96* 103  CO2 32  --  26  BUN 56* 56* 36*  CREATININE 1.66* 1.70* 1.27*  GLUCOSE 162* 161* 150*    Electrolytes  Recent Labs Lab 09/23/16 1227 09/24/16 0125  CALCIUM 8.9 8.8*  MG 2.5* 2.2  PHOS  --  2.6    CBC  Recent Labs Lab 09/23/16 1227 09/23/16 1241 09/23/16 1735 09/24/16 0125  WBC 15.0*  --   --   --   HGB 5.7* 6.8* 6.5* 7.8*  HCT 19.0* 20.0* 21.1* 24.2*  PLT 511*  --   --   --     Coag's  Recent Labs Lab 09/23/16 1227 09/24/16 0125  INR 1.19 1.04    Sepsis Markers No results for input(s): LATICACIDVEN, PROCALCITON, O2SATVEN in the last 168 hours.  ABG No results for input(s): PHART, PCO2ART, PO2ART in the last 168 hours.  Liver Enzymes  Recent Labs Lab 09/23/16 1227  AST 21  ALT 13*  ALKPHOS 33*  BILITOT 0.4  ALBUMIN 3.2*    Cardiac Enzymes  Recent Labs Lab 09/23/16 1735 09/24/16 0125  TROPONINI 7.58* 11.89*     Glucose  Recent Labs Lab 09/23/16 1236 09/23/16 1959  GLUCAP 174* 89    Imaging Dg Chest Portable 1 View  Result Date: 09/23/2016 CLINICAL DATA:  70 year old male with chest pain and shortness of breath today. EXAM: PORTABLE CHEST 1 VIEW COMPARISON:  Chest x-ray 07/11/2015. FINDINGS: There is cephalization of the pulmonary vasculature and slight indistinctness of the interstitial markings suggestive of mild pulmonary edema. Lung volumes are low. No pleural effusions. Heart size is borderline enlarged. Upper mediastinal contours are within normal limits. Atherosclerosis in the thoracic aorta. IMPRESSION: 1. The appearance of the chest suggests developing congestive heart failure, as above. Electronically Signed   By: Trudie Reed M.D.   On: 09/23/2016 12:59     DISCUSSION: 70 y/o M admitted 11/8 with recurrent GIB, ABL, possible COPD exacerbation, NSTEMI.   ASSESSMENT / PLAN: Hem: A: Blood Loss Anemia - suspect GI bleed. Hgb 10.6 in 02/17.  Hx DVT - on Xarelto.  NSAIDS for pain Anemia panel on 11/1 with IDA Hgb 5.2>>3 units>>7.8. Appropriate response P: CBC@11am  then as needed Transfuse for Hgb <8%. Consider iron infusion once Hgb stable.  Hold home Xarelto. May need IVC filter. Follow GI recs  GI: A: GIB likely due to Xarelto. Has Naproxen on his med list.  P: Appreciate GI recs  -PPI bolus followed by gtt  -No EGD with uptrending trop  -Consider another 1-2 units of blood NPO. Only sips and chips  Pulm: A: Dyspnea -likely due to anemia/pulm edema. Net neg on fluid History of COPD. Tobacco Abuse.  PAH and severe LAE per echo 11/8- PAP 40 P:   Pulmonary hygiene - IS, mobilize. O2 as needed for sats 90-95%. Hold home symbicort, transition back prior to discharge. Duoneb Q6 with Q4 PRN albuterol. Hold steroids for now.  CVS: A: Hx HTN, HLD. Mild Aortic stenosis on echo dCHF based on Echo and ^BNP  NSTEMI Trop 7.6>>11.9>>8.8 P:  Card on board.    -Possible cath and dual antiplt after GI eval  -Acceptable risk for EGD Hold home HTN meds Trend Troponin Diurese with blood carefully  Renal A: AKI - b/l 0.74, on metformin, ARB, NSAIDS at home.  Hypokalemia Plan:   Trend BMP / UOP.  Replace electrolytes Hold nephrotoxic agents.   Endocrine: A: DM Hyperlipidemia P: A1c Lipid panel SSI when started feeding.  Neuro: A:  No issues P: Monitor  FAMILY  - Updates:  Patient and family updated at bedside on admission.  Candelaria Stagers, MD.  09/24/16 7:23 AM PGY-2, [facility]   Attending:  I have seen and examined the patient with nurse practitioner/resident and agree with the note above.  We formulated the plan  together and I elicited the following history.    Transfused 3 U yesterday Baseline COPD Troponin elevation HD stable, never needed vasopressors  Gen: Resting comfortably Belly soft, nontender, BS +   GI bleed > minimal bleeding now, hold home Xarelto, f/u CBC, goal Hgb > 10gm, monitor CBC; EGD per GI, cardiology says OK for EGD NSTEMI> no anticoagulation, need to transfuse given active ischemia, appreciate cardiology, no blood thinning agents with active bleed COPD> monitor O2, duoneb prn, add symbicort DVT> hold Xarelto given bleeding  My cc time 35 minutes  Heber CarolinaBrent Yeni Jiggetts, MD [place] [facility] Pager: [phone] Cell: [phone] After 3pm or if no response, call [phone]

## 2016-09-25 ENCOUNTER — Encounter (HOSPITAL_COMMUNITY): Payer: Self-pay | Admitting: *Deleted

## 2016-09-25 ENCOUNTER — Encounter (HOSPITAL_COMMUNITY)
Admission: EM | Discharge status: Against Medical Advice | Payer: Self-pay | Source: Home / Self Care | Attending: Internal Medicine

## 2016-09-25 ENCOUNTER — Inpatient Hospital Stay (HOSPITAL_COMMUNITY): Payer: Medicare Other | Admitting: Certified Registered"

## 2016-09-25 DIAGNOSIS — K5730 Diverticulosis of large intestine without perforation or abscess without bleeding: Secondary | ICD-10-CM

## 2016-09-25 HISTORY — PX: COLONOSCOPY WITH PROPOFOL: SHX5780

## 2016-09-25 LAB — GLUCOSE, CAPILLARY
GLUCOSE-CAPILLARY: 110 mg/dL — AB (ref 65–99)
Glucose-Capillary: 132 mg/dL — ABNORMAL HIGH (ref 65–99)
Glucose-Capillary: 153 mg/dL — ABNORMAL HIGH (ref 65–99)
Glucose-Capillary: 134 mg/dL — ABNORMAL HIGH (ref 65–99)

## 2016-09-25 LAB — MAGNESIUM: Magnesium: 1.9 mg/dL (ref 1.7–2.4)

## 2016-09-25 LAB — CBC
HCT: 28.1 % — ABNORMAL LOW (ref 39.0–52.0)
Hemoglobin: 9.1 g/dL — ABNORMAL LOW (ref 13.0–17.0)
MCH: 26.5 pg (ref 26.0–34.0)
MCHC: 32.4 g/dL (ref 30.0–36.0)
MCV: 81.7 fL (ref 78.0–100.0)
PLATELETS: 349 10*3/uL (ref 150–400)
RBC: 3.44 MIL/uL — ABNORMAL LOW (ref 4.22–5.81)
RDW: 16.7 % — AB (ref 11.5–15.5)
WBC: 12.3 10*3/uL — AB (ref 4.0–10.5)

## 2016-09-25 LAB — BASIC METABOLIC PANEL
Anion gap: 9 (ref 5–15)
BUN: 16 mg/dL (ref 6–20)
CALCIUM: 8.9 mg/dL (ref 8.9–10.3)
CO2: 23 mmol/L (ref 22–32)
CREATININE: 0.86 mg/dL (ref 0.61–1.24)
Chloride: 113 mmol/L — ABNORMAL HIGH (ref 101–111)
GFR calc Af Amer: >60 mL/min (ref >60)
Glucose, Bld: 95 mg/dL (ref 65–99)
Potassium: 3.1 mmol/L — ABNORMAL LOW (ref 3.5–5.1)
SODIUM: 145 mmol/L (ref 135–145)

## 2016-09-25 LAB — TROPONIN I
TROPONIN I: 3.26 ng/mL — AB (ref <0.03)
Troponin I: 4.65 ng/mL (ref <0.03)

## 2016-09-25 LAB — TYPE AND SCREEN
ABO/RH(D): A POS
ANTIBODY SCREEN: NEGATIVE
UNIT DIVISION: 0
UNIT DIVISION: 0
Unit division: 0
Unit division: 0
Unit division: 0

## 2016-09-25 LAB — HEMOGLOBIN A1C
HEMOGLOBIN A1C: 5.2 % (ref 4.8–5.6)
Mean Plasma Glucose: 103 mg/dL

## 2016-09-25 LAB — PHOSPHORUS: PHOSPHORUS: 2.2 mg/dL — AB (ref 2.5–4.6)

## 2016-09-25 SURGERY — COLONOSCOPY WITH PROPOFOL
Anesthesia: Monitor Anesthesia Care

## 2016-09-25 MED ORDER — LACTATED RINGERS IV SOLN
INTRAVENOUS | Status: DC | PRN
  Administered 2016-09-25: 13:00:00 via INTRAVENOUS

## 2016-09-25 MED ORDER — PROPOFOL 500 MG/50ML IV EMUL
INTRAVENOUS | Status: DC | PRN
  Administered 2016-09-25: 150 ug/kg/min via INTRAVENOUS

## 2016-09-25 MED ORDER — POTASSIUM CHLORIDE CRYS ER 20 MEQ PO TBCR: 40.0000 meq | EXTENDED_RELEASE_TABLET | Freq: Once | ORAL | Status: DC

## 2016-09-25 MED ORDER — DEXTROSE 5 % IV SOLN
40.0000 meq | Freq: Once | INTRAVENOUS | Status: AC
  Administered 2016-09-25: 40 meq via INTRAVENOUS
  Filled 2016-09-25: qty 9.09

## 2016-09-25 MED ORDER — SODIUM CHLORIDE 0.9 % IV SOLN
250.0000 mL | INTRAVENOUS | Status: DC | PRN
Start: 2016-09-25 — End: 2016-09-25

## 2016-09-25 MED ORDER — SODIUM CHLORIDE 0.9 % IV SOLN: INTRAVENOUS | Status: DC

## 2016-09-25 MED ORDER — SODIUM CHLORIDE 0.9 % IV SOLN
30.0000 meq | Freq: Once | INTRAVENOUS | Status: DC
  Filled 2016-09-25: qty 15

## 2016-09-25 MED ORDER — INSULIN ASPART 100 UNIT/ML ~~LOC~~ SOLN: 0.0000 [IU] | SUBCUTANEOUS | Status: DC

## 2016-09-25 MED ORDER — POTASSIUM CHLORIDE IN NACL 20-0.9 MEQ/L-% IV SOLN
INTRAVENOUS | Status: DC
  Administered 2016-09-25: 10:00:00 via INTRAVENOUS
  Filled 2016-09-25: qty 1000

## 2016-09-25 MED ORDER — PROPOFOL 10 MG/ML IV BOLUS
INTRAVENOUS | Status: DC | PRN
  Administered 2016-09-25: 50 mg via INTRAVENOUS
  Administered 2016-09-25: 80 mg via INTRAVENOUS
  Administered 2016-09-25: 30 mg via INTRAVENOUS
  Administered 2016-09-25: 40 mg via INTRAVENOUS

## 2016-09-25 MED ORDER — LIDOCAINE HCL (CARDIAC) 20 MG/ML IV SOLN
INTRAVENOUS | Status: DC | PRN
  Administered 2016-09-25: 100 mg via INTRATRACHEAL

## 2016-09-25 MED ORDER — METOPROLOL TARTRATE 5 MG/5ML IV SOLN
5.0000 mg | Freq: Three times a day (TID) | INTRAVENOUS | Status: DC
  Administered 2016-09-25: 5 mg via INTRAVENOUS
  Filled 2016-09-25: qty 5

## 2016-09-25 MED ORDER — ATROPINE SULFATE 1 MG/10ML IJ SOSY
PREFILLED_SYRINGE | INTRAMUSCULAR | Status: AC
  Filled 2016-09-25: qty 10

## 2016-09-25 MED ORDER — SODIUM CHLORIDE 0.9 % IV SOLN: 30.0000 meq | Freq: Every day | INTRAVENOUS | Status: DC

## 2016-09-25 NOTE — Anesthesia Procedure Notes (Signed)
Procedure Name: MAC Date/Time: 09/25/2016 1:08 PM Performed by: Rosiland OzMEYERS, Berklee Battey Pre-anesthesia Checklist: Patient identified, Emergency Drugs available, Suction available, Patient being monitored and Timeout performed Patient Re-evaluated:Patient Re-evaluated prior to inductionOxygen Delivery Method: Nasal cannula Intubation Type: IV induction

## 2016-09-25 NOTE — Progress Notes (Signed)
Patient decided to leave the hospital against medical advice after the risks of doing so were thoroughly explained to him by this RN and by Dr. Sharon SellerMcClung. Patient stated: "I'd rather die than to stay in here like this."  Patient signed the AMA form.  Peripheral IVs. Telemetry, and foley catheter removed.  Patient insisted on walking out and was, therefore, assisted by this RN to the ED.  He was helped to a seat near the intake desk and encouraged to call a family member to pick him up.  Patient verbalized agreement.

## 2016-09-25 NOTE — Progress Notes (Signed)
[place] TEAM 1 - Stepdown/ICU TEAM  Cristi LoronSanford Vo  ZOX:[id number] DOB: 01/29/1946 DOA: 09/23/2016 PCP: Eino FarberKILPATRICK JR,GEORGE R, MD    Brief Narrative:  70 y/o M with Hx of DM with neuropathy, DVT on xarelto, HTN, HLD, CVA, 4L O2 dependent COPD, and anemia who [facility] with reports of chest pain, indigestion, cough with pink frothy sputum, and shortness of breath.  He was seen on 11/1 for similar symptoms and left AMA to care for his animals at home.  During that admission, he was transfused 2 units PRBC and Hgb increased to 7.5.  He continued to take his Xarelto.    ER evaluation notable for mild elevation of troponin w/ Hgb 5.7.  EKG with ST depression.  FOBT positive.   Significant Events: 11/1 left AMA 11/8 admit w/ GIB / severe anemia  11/9 EGD: 3 small gastric AVMs s/p cautery - normal esoph and duodenum   Subjective: Resting comfortably.  Denies cp, sob, n/v, or abdom pain.  No melena or hematochezia.    Assessment & Plan:  GIB Source unclear per EGD - for colo today - GI following   Acute blood loss anemia  Due to GIB - follow trend - keep Hgb >7.9  Recent Labs Lab 09/23/16 1735 09/24/16 0125 09/24/16 0750 09/24/16 1859 09/25/16 0230  HGB 6.5* 7.8* 7.6* 9.3* 9.1*    NSTEMI Troponin peaked at 11.9 - Cards following - for cath once GI w/u completed   COPD - PAH and severe LAE per echo 11/8  DVT On Xarelto at admit  Hypokalemia  Supplement and follow - Mg is ok  HTN Follow w/o change for now   HLD  Mild Aortic stenosis  AKI Baseline 0.74  DM Reasonably controlled at present - A1c 5.2  DVT prophylaxis: SCDs Code Status: FULL CODE Family Communication: no family present at time of exam  Disposition Plan: SDU   Consultants:  [facility]   Antimicrobials:  none   Objective: Blood pressure (!) 142/85, pulse 73, temperature 98.6 F (37 C), temperature source Oral, resp. rate 14, height 5\' 6"  (1.676 m), weight  92.5 kg (203 lb 14.8 oz), SpO2 99 %.  Intake/Output Summary (Last 24 hours) at 09/25/16 0847 Last data filed at 09/25/16 0800  Gross per 24 hour  Intake           1882.4 ml  Output             2855 ml  Net           -972.6 ml   Filed Weights   09/23/16 1243 09/24/16 0300 09/25/16 0353  Weight: 97.1 kg (214 lb) 93.8 kg (206 lb 12.7 oz) 92.5 kg (203 lb 14.8 oz)    Examination: General: No acute respiratory distress Lungs: Clear to auscultation bilaterally without wheezes or crackles Cardiovascular: Regular rate and rhythm with 2/6 holosystolic M Abdomen: Nontender, nondistended, soft, bowel sounds positive, no rebound, no ascites, no appreciable mass Extremities: No significant cyanosis, clubbing, or edema bilateral lower extremities  CBC:  Recent Labs Lab 09/23/16 1227  09/23/16 1735 09/24/16 0125 09/24/16 0750 09/24/16 1859 09/25/16 0230  WBC 15.0*  --   --   --   --   --  12.3*  HGB 5.7*  < > 6.5* 7.8* 7.6* 9.3* 9.1*  HCT 19.0*  < > 21.1* 24.2* 23.8* 29.5* 28.1*  MCV 84.1  --   --   --   --   --  81.7  PLT 511*  --   --   --   --   --  349  < > = values in this interval not displayed. Basic Metabolic Panel:  Recent Labs Lab 09/23/16 1227 09/23/16 1241 09/24/16 0125 09/24/16 1859 09/25/16 0230  NA 142 140 139 142 145  K 3.6 3.6 3.1* 3.4* 3.1*  CL 99* 96* 103 109 113*  CO2 32  --  26 23 23   GLUCOSE 162* 161* 150* 142* 95  BUN 56* 56* 36* 19 16  CREATININE 1.66* 1.70* 1.27* 0.96 0.86  CALCIUM 8.9  --  8.8* 9.0 8.9  MG 2.5*  --  2.2  --  1.9  PHOS  --   --  2.6  --  2.2*   GFR: Estimated Creatinine Clearance: 86.3 mL/min (by C-G formula based on SCr of 0.86 mg/dL).  Liver Function Tests:  Recent Labs Lab 09/23/16 1227  AST 21  ALT 13*  ALKPHOS 33*  BILITOT 0.4  PROT 5.9*  ALBUMIN 3.2*    Recent Labs Lab 09/23/16 1236  LIPASE 44   Coagulation Profile:  Recent Labs Lab 09/23/16 1227 09/24/16 0125  INR 1.19 1.04    Cardiac  Enzymes:  Recent Labs Lab 09/23/16 1735 09/24/16 0125 09/24/16 0750 09/24/16 1859 09/25/16 0218  TROPONINI 7.58* 11.89* 8.85* 6.18* 4.65*    HbA1C: Hgb A1c MFr Bld  Date/Time Value Ref Range Status  09/24/2016 07:50 AM 5.2 4.8 - 5.6 % Final    Comment:    (NOTE)         Pre-diabetes: 5.7 - 6.4         Diabetes: >6.4         Glycemic control for adults with diabetes: <7.0   06/09/2015 02:39 PM 5.6 4.8 - 5.6 % Final    Comment:    (NOTE)         Pre-diabetes: 5.7 - 6.4         Diabetes: >6.4         Glycemic control for adults with diabetes: <7.0     CBG:  Recent Labs Lab 09/23/16 1236 09/23/16 1959 09/24/16 1122 09/25/16 0412  GLUCAP 174* 89 185* 132*    Recent Results (from the past 240 hour(s))  MRSA PCR Screening     Status: None   Collection Time: 09/16/16  3:00 AM  Result Value Ref Range Status   MRSA by PCR NEGATIVE NEGATIVE Final    Comment:        The GeneXpert MRSA Assay (FDA approved for NASAL specimens only), is one component of a comprehensive MRSA colonization surveillance program. It is not intended to diagnose MRSA infection nor to guide or monitor treatment for MRSA infections.   MRSA PCR Screening     Status: None   Collection Time: 09/23/16  8:41 PM  Result Value Ref Range Status   MRSA by PCR NEGATIVE NEGATIVE Final    Comment:        The GeneXpert MRSA Assay (FDA approved for NASAL specimens only), is one component of a comprehensive MRSA colonization surveillance program. It is not intended to diagnose MRSA infection nor to guide or monitor treatment for MRSA infections.      Scheduled Meds: . atropine      . mometasone-formoterol  2 puff Inhalation BID  . pantoprazole  40 mg Oral Q0600  . potassium chloride (KCL MULTIRUN) 30 mEq in 265 mL IVPB  30 mEq Intravenous Once  . potassium phosphate IVPB (  mEq)  40 mEq Intravenous Once     LOS: 2 days   Lonia Blood, MD [facility] Office   [phone] Pager - Text Page per Amion as per below:  On-Call/Text Page:      [email or web address]      password [email or web address]  If 7PM-7AM, please contact night-coverage [email or web address] Password [facility] 09/25/2016, 8:47 AM

## 2016-09-25 NOTE — Progress Notes (Signed)
Daily Rounding Note  09/25/2016, 8:26 AM  LOS: 2 days   SUBJECTIVE:   Chief complaint: chest pain.  Has not recurred.  Denies SOB, dizziness, weakness    Stool watery and brown, just finishing the second liter of moviprep.    OBJECTIVE:         Vital signs in last 24 hours:    Temp:  [97.8 F (36.6 C)-99.2 F (37.3 C)] 98.6 F (37 C) (11/10 0805) Pulse Rate:  [49-74] 57 (11/10 0600) Resp:  [11-28] 18 (11/10 0600) BP: (107-147)/(57-84) 145/62 (11/10 0530) SpO2:  [94 %-100 %] 98 % (11/10 0600) Weight:  [92.5 kg (203 lb 14.8 oz)] 92.5 kg (203 lb 14.8 oz) (11/10 0353) Last BM Date: 09/24/16 Filed Weights   09/23/16 1243 09/24/16 0300 09/25/16 0353  Weight: 97.1 kg (214 lb) 93.8 kg (206 lb 12.7 oz) 92.5 kg (203 lb 14.8 oz)   General: pleasant, looks well.    Heart: RRR Chest: clear bil.  No labored breathing. + mucoid cough.  Abdomen: soft, NT.  ND.  Active BS  Extremities: no CCE.   Neuro/Psych:  Oriented x 3.  Pleasant, alert.    Intake/Output from previous day: 11/09 0701 - 11/10 0700 In: 1972.9 [I.V.:623.8; Blood:670; IV Piggyback:609.1] Out: 2895 [Urine:2895]  Intake/Output this shift: Total I/O In: -  Out: 160 [Urine:160]  Lab Results:  Recent Labs  09/23/16 1227  09/24/16 0750 09/24/16 1859 09/25/16 0230  WBC 15.0*  --   --   --  12.3*  HGB 5.7*  < > 7.6* 9.3* 9.1*  HCT 19.0*  < > 23.8* 29.5* 28.1*  PLT 511*  --   --   --  349  < > = values in this interval not displayed. BMET  Recent Labs  09/24/16 0125 09/24/16 1859 09/25/16 0230  NA 139 142 145  K 3.1* 3.4* 3.1*  CL 103 109 113*  CO2 26 23 23   GLUCOSE 150* 142* 95  BUN 36* 19 16  CREATININE 1.27* 0.96 0.86  CALCIUM 8.8* 9.0 8.9   LFT  Recent Labs  09/23/16 1227  PROT 5.9*  ALBUMIN 3.2*  AST 21  ALT 13*  ALKPHOS 33*  BILITOT 0.4   PT/INR  Recent Labs  09/23/16 1227 09/24/16 0125  LABPROT 15.2 13.6  INR 1.19  1.04   Hepatitis Panel No results for input(s): HEPBSAG, HCVAB, HEPAIGM, HEPBIGM in the last 72 hours.  Studies/Results: Dg Chest Portable 1 View  Result Date: 09/23/2016 CLINICAL DATA:  70 year old male with chest pain and shortness of breath today. EXAM: PORTABLE CHEST 1 VIEW COMPARISON:  Chest x-ray 07/11/2015. FINDINGS: There is cephalization of the pulmonary vasculature and slight indistinctness of the interstitial markings suggestive of mild pulmonary edema. Lung volumes are low. No pleural effusions. Heart size is borderline enlarged. Upper mediastinal contours are within normal limits. Atherosclerosis in the thoracic aorta. IMPRESSION: 1. The appearance of the chest suggests developing congestive heart failure, as above. Electronically Signed   By: Trudie Reedaniel  Entrikin M.D.   On: 09/23/2016 12:59    ASSESMENT:   *  FOBT + anemia.  NSAIDs at home.  S/p PRBC x 5.  11/0/17 EGD: Dr Christella HartiganJacobs cautery ablated 3, non-bleeding gastric AVMs.  Now on once daily PPI, though no mucosal disruption or ulcertions at EGD.     *  Non STEMI in setting of severe anemia and demand ischemia. Troponins peaked a 11. 8 on 11/9, now on  decline to 4.6.   *  DVT 10/21.  Xarelto on hold.   *  Hypokalemia.   Got K dur 60 mg at 0300 and 40 meq of IV potassium at 0348  PLAN   *  Colonoscopy at 1300 today.     Parneet Glantz  09/25/2016, 8:26 AM Pager: [phone] 

## 2016-09-25 NOTE — Interval H&P Note (Signed)
History and Physical Interval Note:  09/25/2016 12:52 PM  Gregory Walker  has presented today for surgery, with the diagnosis of melena, fobt + stool, anemia  The various methods of treatment have been discussed with the patient and family. After consideration of risks, benefits and other options for treatment, the patient has consented to  Procedure(s): COLONOSCOPY WITH PROPOFOL (N/A) as a surgical intervention .  The patient's history has been reviewed, patient examined, no change in status, stable for surgery.  I have reviewed the patient's chart and labs.  Questions were answered to the patient's satisfaction.     Rachael FeeJacobs, Daniel P

## 2016-09-25 NOTE — Anesthesia Postprocedure Evaluation (Signed)
Anesthesia Post Note  Patient: Gregory Walker  Procedure(s) Performed: Procedure(s) (LRB): COLONOSCOPY WITH PROPOFOL (N/A)  Patient location during evaluation: Endoscopy Anesthesia Type: MAC Level of consciousness: awake, awake and alert and oriented Pain management: pain level controlled Vital Signs Assessment: post-procedure vital signs reviewed and stable Respiratory status: spontaneous breathing, nonlabored ventilation, respiratory function stable and patient connected to nasal cannula oxygen Cardiovascular status: blood pressure returned to baseline Anesthetic complications: no    Last Vitals:  Vitals:   09/25/16 1400 09/25/16 1532  BP: (!) 146/72 (!) 154/68  Pulse:    Resp:    Temp: 36.7 C 36.6 C    Last Pain:  Vitals:   09/25/16 1532  TempSrc: Oral  PainSc:                  [name]

## 2016-09-25 NOTE — Progress Notes (Signed)
Patient Name: Gregory Walker Date of Encounter: 09/25/2016  [facility]   Hospital Problem List     Active Problems:   Gastrointestinal hemorrhage   Cardiac ischemia     Subjective   Pt admitted with CP / pressure while walking to dog kennels on the day of admission  Found to have profound anemia and heme + stools. Developed respiratory distress.  Hb is up from 5.7 on admission to 7.8 after transfusions. Troponin is 11.89.  His CP has resolved.  ECG changes have resolved.  Has diuresed 1.8 liters  Inpatient Medications    Scheduled Meds: . atropine      . mometasone-formoterol  2 puff Inhalation BID  . pantoprazole  40 mg Oral Q0600  . potassium chloride (KCL MULTIRUN) 30 mEq in 265 mL IVPB  30 mEq Intravenous Once  . potassium phosphate IVPB (mEq)  40 mEq Intravenous Once   Continuous Infusions: . nitroGLYCERIN Stopped (09/25/16 0105)   PRN Meds: sodium chloride, ALPRAZolam, ipratropium-albuterol   Vital Signs    Vitals:   09/25/16 0500 09/25/16 0530 09/25/16 0600 09/25/16 0805  BP: 138/74 (!) 145/62    Pulse: 70 (!) 49 (!) 57   Resp: 16 (!) 23 18   Temp:    98.6 F (37 C)  TempSrc:    Oral  SpO2: 98% 98% 98%   Weight:      Height:        Intake/Output Summary (Last 24 hours) at 09/25/16 0831 Last data filed at 09/25/16 0800  Gross per 24 hour  Intake           1862.4 ml  Output             2855 ml  Net           -992.6 ml   Filed Weights   09/23/16 1243 09/24/16 0300 09/25/16 0353  Weight: 214 lb (97.1 kg) 206 lb 12.7 oz (93.8 kg) 203 lb 14.8 oz (92.5 kg)    Physical Exam    GEN: Well nourished, well developed, in no acute distress.  HEENT: Grossly normal.  Neck: Supple, no JVD, carotid bruits, or masses. Cardiac: RRR, 2/6 systolic murmur , no  rubs, or gallops. No  , edema.  Radials/DP/PT 2+ and equal bilaterally.  Respiratory:  Bilateral rales in bases . GI: Soft, nontender, nondistended, BS + x 4. MS: no  deformity or atrophy. Skin: warm and dry, no rash. Neuro:  Strength and sensation are intact. Psych: AAOx3.  Normal affect.  Labs    CBC  Recent Labs  09/23/16 1227  09/24/16 1859 09/25/16 0230  WBC 15.0*  --   --  12.3*  HGB 5.7*  < > 9.3* 9.1*  HCT 19.0*  < > 29.5* 28.1*  MCV 84.1  --   --  81.7  PLT 511*  --   --  349  < > = values in this interval not displayed. Basic Metabolic Panel  Recent Labs  09/24/16 0125 09/24/16 1859 09/25/16 0230  NA 139 142 145  K 3.1* 3.4* 3.1*  CL 103 109 113*  CO2 26 23 23   GLUCOSE 150* 142* 95  BUN 36* 19 16  CREATININE 1.27* 0.96 0.86  CALCIUM 8.8* 9.0 8.9  MG 2.2  --  1.9  PHOS 2.6  --  2.2*   Liver Function Tests  Recent Labs  09/23/16 1227  AST 21  ALT 13*  ALKPHOS 33*  BILITOT 0.4  PROT 5.9*  ALBUMIN 3.2*    Recent Labs  09/23/16 1236  LIPASE 44   Cardiac Enzymes  Recent Labs  09/24/16 0750 09/24/16 1859 09/25/16 0218  TROPONINI 8.85* 6.18* 4.65*   BNP Invalid input(s): POCBNP D-Dimer No results for input(s): DDIMER in the last 72 hours. Hemoglobin A1C  Recent Labs  09/24/16 0750  HGBA1C 5.2   Fasting Lipid Panel  Recent Labs  09/24/16 0750  CHOL 120  HDL 31*  LDLCALC 69  TRIG 161102  CHOLHDL 3.9   Thyroid Function Tests No results for input(s): TSH, T4TOTAL, T3FREE, THYROIDAB in the last 72 hours.  Invalid input(s): FREET3  Telemetry    NSR  - Personally Reviewed  ECG    NSR at 71.   Very minimal ST depression .   Radiology    Dg Chest Portable 1 View  Result Date: 09/23/2016 CLINICAL DATA:  70 year old male with chest pain and shortness of breath today. EXAM: PORTABLE CHEST 1 VIEW COMPARISON:  Chest x-ray 07/11/2015. FINDINGS: There is cephalization of the pulmonary vasculature and slight indistinctness of the interstitial markings suggestive of mild pulmonary edema. Lung volumes are low. No pleural effusions. Heart size is borderline enlarged. Upper mediastinal contours are  within normal limits. Atherosclerosis in the thoracic aorta. IMPRESSION: 1. The appearance of the chest suggests developing congestive heart failure, as above. Electronically Signed   By: Trudie Reedaniel  Entrikin M.D.   On: 09/23/2016 12:59    Cardiac Studies    Patient Profile       Assessment & Plan    1. CAD:   Pt likely has CAD given his ST depression and CP but he is very stable at this point.   Troponin levels peaked at 11, now have decreased to 4.6 Symptoms are much better  No CP or dyspnea at present  He needs to have the etiology of his GI bleed evaluated and treated before we can cath and potentially place stents ( which would obligate him to a year of dual antiplatelet therapy .  He is currently stable and is at acceptable risk for EGD today .   Going for colonoscopy later today   2. Acute diastolic dysfunction:   Better after diuresis and correction of his anemia .   3. Aortic stenosis - mild ,    Signed, Kristeen MissPhilip Georgi Navarrete, MD  09/25/2016, 8:31 AM

## 2016-09-25 NOTE — Progress Notes (Signed)
Called resident regarding SB after multiple titrations down from nitro drip. Patient states no CP and will stop nitro now and will continue to monitor. Suzy Bouchardhompson, Davinder Haff E, RN 09/25/2016 1:07 AM

## 2016-09-25 NOTE — Transfer of Care (Signed)
Immediate Anesthesia Transfer of Care Note  Patient: Gregory Walker  Procedure(s) Performed: Procedure(s): COLONOSCOPY WITH PROPOFOL (N/A)  Patient Location: Endoscopy Unit  Anesthesia Type:MAC  Level of Consciousness: awake and patient cooperative  Airway & Oxygen Therapy: Patient Spontanous Breathing  Post-op Assessment: Report given to RN and Post -op Vital signs reviewed and stable  Post vital signs: Reviewed and stable  Last Vitals:  Vitals:   09/25/16 1142 09/25/16 1216  BP:  (!) 159/62  Pulse:  (!) 51  Resp:  18  Temp: 37.1 C 36.9 C    Last Pain:  Vitals:   09/25/16 1216  TempSrc: Oral  PainSc:          Complications: No apparent anesthesia complications

## 2016-09-25 NOTE — Op Note (Signed)
[facility] Patient Name: Gregory Walker Procedure Date : 09/25/2016 MRN: [id number] Attending MD: Rachael Fee , MD Date of Birth: [birth year] CSN: [id number] Age: 70 Admit Type: Inpatient Procedure:                Colonoscopy Indications:              Severe anemia, FOBT +, one episode of melena, EGD                            yesterday was fairly unrevealing, demand related AMI Providers:                Rachael Fee, MD, Michel Bickers, RN, Arlee Muslim Tech., Technician, Rosiland Oz, CRNA Referring MD:              Medicines:                Monitored Anesthesia Care Complications:            No immediate complications. Estimated blood loss:                            None. Estimated Blood Loss:     Estimated blood loss: none. Procedure:                Pre-Anesthesia Assessment:                           - Prior to the procedure, a History and Physical                            was performed, and patient medications and                            allergies were reviewed. The patient's tolerance of                            previous anesthesia was also reviewed. The risks                            and benefits of the procedure and the sedation                            options and risks were discussed with the patient.                            All questions were answered, and informed consent                            was obtained. Prior Anticoagulants: The patient has                            taken no previous anticoagulant or antiplatelet  agents. ASA Grade Assessment: IV - A patient with                            severe systemic disease that is a constant threat                            to life. After reviewing the risks and benefits,                            the patient was deemed in satisfactory condition to                            undergo the procedure.   After obtaining informed consent, the colonoscope                            was passed under direct vision. Throughout the                            procedure, the patient's blood pressure, pulse, and                            oxygen saturations were monitored continuously. The                            EC-[id number] ([id number] scope was introduced through                            the anus and advanced to the the cecum, identified                            by appendiceal orifice and ileocecal valve. The                            colonoscopy was performed without difficulty. The                            patient tolerated the procedure well. The quality                            of the bowel preparation was adequate to identify                            polyps 6 mm and larger in size. The ileocecal                            valve, appendiceal orifice, and rectum were                            photographed. Scope In: 1:17:22 PM Scope Out: 1:37:34 PM Scope Withdrawal Time: 0 hours 4 minutes 3 seconds  Total Procedure Duration: 0 hours 20 minutes 12 seconds  Findings:      A few small-mouthed diverticula were found in the entire colon.  The exam was otherwise without abnormality on direct and retroflexion       views. Impression:               - A few small diverticulum throughout the colon.                           - The examination was otherwise normal on direct                            and retroflexion views.                           - No recent or old blood. Moderate Sedation:      none Recommendation:           - Return patient to hospital ward for ongoing care.                           - Advance diet as tolerated.                           - Continue present medications.                           - No obvious causes for severe anemia in his colon.                            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                            Otherwise the GI workup for his anemia is negative.                            I recommend proceeding with any necessary cardiac                            testing, procedures. Please call or page if any                            further questions or concerns. Procedure Code(s):        --- Professional ---                           [id number], Colonoscopy, flexible; diagnostic, including                            collection of specimen(s) by brushing or washing,                            when performed (separate procedure) Diagnosis Code(s):        --- Professional ---  D50.9, Iron deficiency anemia, unspecified                           K57.30, Diverticulosis of large intestine without                            perforation or abscess without bleeding CPT copyright 2016 [employer] Association. All rights reserved. The codes documented in this report are preliminary and upon coder review may  be revised to meet current compliance requirements. Rachael Fee, MD 09/25/2016 1:45:37 PM This report has been signed electronically. Number of Addenda: 0

## 2016-09-25 NOTE — Anesthesia Preprocedure Evaluation (Addendum)
Anesthesia Evaluation  Patient identified by MRN, date of birth, ID band Patient awake    Reviewed: Allergy & Precautions, NPO status , Patient's Chart, lab work & pertinent test results  Airway Mallampati: II  TM Distance: >3 FB Neck ROM: Full    Dental  (+) Edentulous Upper   Pulmonary Current Smoker,    breath sounds clear to auscultation       Cardiovascular hypertension,  Rhythm:Regular Rate:Normal     Neuro/Psych    GI/Hepatic   Endo/Other  diabetes  Renal/GU      Musculoskeletal   Abdominal   Peds  Hematology   Anesthesia Other Findings   Reproductive/Obstetrics                            Anesthesia Physical Anesthesia Plan  ASA: III  Anesthesia Plan: MAC   Post-op Pain Management:    Induction:   Airway Management Planned: Natural Airway and Simple Face Mask  Additional Equipment:   Intra-op Plan:   Post-operative Plan:   Informed Consent: I have reviewed the patients History and Physical, chart, labs and discussed the procedure including the risks, benefits and alternatives for the proposed anesthesia with the patient or authorized representative who has indicated his/her understanding and acceptance.     Plan Discussed with: CRNA and Anesthesiologist  Anesthesia Plan Comments:         Anesthesia Quick Evaluation

## 2016-09-25 NOTE — H&P (View-Only) (Signed)
Daily Rounding Note  09/25/2016, 8:26 AM  LOS: 2 days   SUBJECTIVE:   Chief complaint: chest pain.  Has not recurred.  Denies SOB, dizziness, weakness    Stool watery and brown, just finishing the second liter of moviprep.    OBJECTIVE:         Vital signs in last 24 hours:    Temp:  [97.8 F (36.6 C)-99.2 F (37.3 C)] 98.6 F (37 C) (11/10 0805) Pulse Rate:  [49-74] 57 (11/10 0600) Resp:  [11-28] 18 (11/10 0600) BP: (107-147)/(57-84) 145/62 (11/10 0530) SpO2:  [94 %-100 %] 98 % (11/10 0600) Weight:  [92.5 kg (203 lb 14.8 oz)] 92.5 kg (203 lb 14.8 oz) (11/10 0353) Last BM Date: 09/24/16 Filed Weights   09/23/16 1243 09/24/16 0300 09/25/16 0353  Weight: 97.1 kg (214 lb) 93.8 kg (206 lb 12.7 oz) 92.5 kg (203 lb 14.8 oz)   General: pleasant, looks well.    Heart: RRR Chest: clear bil.  No labored breathing. + mucoid cough.  Abdomen: soft, NT.  ND.  Active BS  Extremities: no CCE.   Neuro/Psych:  Oriented x 3.  Pleasant, alert.    Intake/Output from previous day: 11/09 0701 - 11/10 0700 In: 1972.9 [I.V.:623.8; Blood:670; IV Piggyback:609.1] Out: 2895 [Urine:2895]  Intake/Output this shift: Total I/O In: -  Out: 160 [Urine:160]  Lab Results:  Recent Labs  09/23/16 1227  09/24/16 0750 09/24/16 1859 09/25/16 0230  WBC 15.0*  --   --   --  12.3*  HGB 5.7*  < > 7.6* 9.3* 9.1*  HCT 19.0*  < > 23.8* 29.5* 28.1*  PLT 511*  --   --   --  349  < > = values in this interval not displayed. BMET  Recent Labs  09/24/16 0125 09/24/16 1859 09/25/16 0230  NA 139 142 145  K 3.1* 3.4* 3.1*  CL 103 109 113*  CO2 26 23 23   GLUCOSE 150* 142* 95  BUN 36* 19 16  CREATININE 1.27* 0.96 0.86  CALCIUM 8.8* 9.0 8.9   LFT  Recent Labs  09/23/16 1227  PROT 5.9*  ALBUMIN 3.2*  AST 21  ALT 13*  ALKPHOS 33*  BILITOT 0.4   PT/INR  Recent Labs  09/23/16 1227 09/24/16 0125  LABPROT 15.2 13.6  INR 1.19  1.04   Hepatitis Panel No results for input(s): HEPBSAG, HCVAB, HEPAIGM, HEPBIGM in the last 72 hours.  Studies/Results: Dg Chest Portable 1 View  Result Date: 09/23/2016 CLINICAL DATA:  70 year old male with chest pain and shortness of breath today. EXAM: PORTABLE CHEST 1 VIEW COMPARISON:  Chest x-ray 07/11/2015. FINDINGS: There is cephalization of the pulmonary vasculature and slight indistinctness of the interstitial markings suggestive of mild pulmonary edema. Lung volumes are low. No pleural effusions. Heart size is borderline enlarged. Upper mediastinal contours are within normal limits. Atherosclerosis in the thoracic aorta. IMPRESSION: 1. The appearance of the chest suggests developing congestive heart failure, as above. Electronically Signed   By: Trudie Reedaniel  Entrikin M.D.   On: 09/23/2016 12:59    ASSESMENT:   *  FOBT + anemia.  NSAIDs at home.  S/p PRBC x 5.  11/0/17 EGD: Dr Christella HartiganJacobs cautery ablated 3, non-bleeding gastric AVMs.  Now on once daily PPI, though no mucosal disruption or ulcertions at EGD.     *  Non STEMI in setting of severe anemia and demand ischemia. Troponins peaked a 11. 8 on 11/9, now on  decline to 4.6.   *  DVT 10/21.  Xarelto on hold.   *  Hypokalemia.   Got K dur 60 mg at 0300 and 40 meq of IV potassium at 0348  PLAN   *  Colonoscopy at 1300 today.     Jennye MoccasinSarah Laurren Lepkowski  09/25/2016, 8:26 AM Pager: [phone]

## 2016-09-25 NOTE — Progress Notes (Signed)
Patient arrived to 4E24 via wheelchair from Endo.  Patient alert and oriented X 4.  VSS. Will continue to monitor.

## 2016-09-28 ENCOUNTER — Encounter (HOSPITAL_COMMUNITY): Payer: Self-pay | Admitting: Gastroenterology

## 2016-09-28 NOTE — Discharge Summary (Addendum)
   PT LEFT AMA SUMMARY  Gregory LoronSanford Baumgart MRN - [id number] DOB - 12/01/1945  Date of Admission - 09/23/2016 Date LEFT AMA: 09/25/2016  Attending Physician:  Jetty DuhamelMCCLUNG,Anuradha Chabot T  Patient's PCP:  Eino FarberKILPATRICK JR,GEORGE R, MD  Disposition: LEFT AMA  Follow-up Appts:  Not able to be arranged or discussed as pt LEFT AMA  Diagnoses at time pt LEFT AMA: GIB Acute blood loss anemia  NSTEMI COPD - PAH and severe LAEper echo 11/8 DVT Hypokalemia  HTN HLD Mild Aortic stenosis AKI DM  Initial presentation: 70 y/o M with Hx of DM with neuropathy, DVT on xarelto, HTN, HLD, CVA, 4L O2 dependent COPD, and anemia who [facility] with reports of chest pain, indigestion, cough with pink frothy sputum, and shortness of breath.  He was seen on 11/1 for similar symptoms and left AMA to care for his animals at home. During that admission, he was transfused 2 units PRBC and Hgb increased to 7.5. He continued to take his Xarelto.   ER evaluation notable for mild elevation of troponin w/ Hgb 5.7. EKG with ST depression.  FOBT positive.   Hospital Course:  Significant Events - 11/1 left AMA 11/8 This admit w/ GIB / severe anemia  11/9 EGD: 3 small gastric AVMs s/p cautery - normal esoph and duodenum  11/10 colonoscopy 11/10 left AMA AGAIN  Listed below are the active problems present, and the status of the care of these problems, at the time the pt decided to LEAVE AMA:  GIB EGD noted 3 small AVMs - colo noted no obvious source of blood loss - GI followed  Acute blood loss anemia  Due to GIB - follow trend - keep Hgb >7.9  NSTEMI Troponin peaked at 11.9 - Cards following - for cath once GI w/u completed - counseled pt on need to have cardiac evaluation prior to his d/c and risk of death if this is not done and he insisted that he was leaving AMA  COPD - PAH and severe LAEper echo 11/8  DVT On Xarelto at admit  Hypokalemia  Supplement and follow - Mg is  ok  HTN Follow w/o change for now   HLD  Mild Aortic stenosis  AKI Baseline0.74  DM Reasonably controlled at present - A1c 5.2    Medication List    Unable to be finalized as pt LEFT AMA  Day of Discharge Wt Readings from Last 3 Encounters:  09/25/16 92.5 kg (203 lb 14.8 oz)  09/16/16 94.3 kg (207 lb 12.8 oz)  09/05/16 99.8 kg (220 lb)   Temp Readings from Last 3 Encounters:  09/25/16 97.8 F (36.6 C) (Oral)  09/16/16 98.3 F (36.8 C) (Oral)  09/05/16 98.9 F (37.2 C)   BP Readings from Last 3 Encounters:  09/25/16 (!) 154/68  09/16/16 (!) 108/58  09/11/16 123/66   Pulse Readings from Last 3 Encounters:  09/25/16 64  09/16/16 75  09/11/16 (!) 104    Physical Exam: Exam not able to be completed at time of d/c as pt LEFT AMA  5:05 PM 09/28/16  Lonia BloodJeffrey T. Iantha Titsworth, MD [facility] Office  [phone] Pager [phone]  On-Call/Text Page:      [email or web address]      password [email or web address]

## 2016-10-01 ENCOUNTER — Encounter: Payer: Self-pay | Admitting: Gastroenterology

## 2016-10-01 ENCOUNTER — Other Ambulatory Visit (INDEPENDENT_AMBULATORY_CARE_PROVIDER_SITE_OTHER): Payer: Medicare Other

## 2016-10-01 ENCOUNTER — Ambulatory Visit (INDEPENDENT_AMBULATORY_CARE_PROVIDER_SITE_OTHER): Payer: Medicare Other | Admitting: Gastroenterology

## 2016-10-01 VITALS — BP 128/58 | HR 76 | Ht 66.0 in | Wt 206.0 lb

## 2016-10-01 DIAGNOSIS — I214 Non-ST elevation (NSTEMI) myocardial infarction: Secondary | ICD-10-CM

## 2016-10-01 DIAGNOSIS — I82492 Acute embolism and thrombosis of other specified deep vein of left lower extremity: Secondary | ICD-10-CM | POA: Diagnosis not present

## 2016-10-01 DIAGNOSIS — K922 Gastrointestinal hemorrhage, unspecified: Secondary | ICD-10-CM | POA: Diagnosis not present

## 2016-10-01 LAB — CBC WITH DIFFERENTIAL/PLATELET
BASOS ABS: 0 10*3/uL (ref 0.0–0.1)
BASOS PCT: 0.2 % (ref 0.0–3.0)
Eosinophils Absolute: 0.1 10*3/uL (ref 0.0–0.7)
Eosinophils Relative: 1.6 % (ref 0.0–5.0)
HCT: 29.4 % — ABNORMAL LOW (ref 39.0–52.0)
Hemoglobin: 9.5 g/dL — ABNORMAL LOW (ref 13.0–17.0)
LYMPHS ABS: 1.6 10*3/uL (ref 0.7–4.0)
Lymphocytes Relative: 21.4 % (ref 12.0–46.0)
MCHC: 32.2 g/dL (ref 30.0–36.0)
MCV: 83.2 fl (ref 78.0–100.0)
MONOS PCT: 7.1 % (ref 3.0–12.0)
Monocytes Absolute: 0.5 10*3/uL (ref 0.1–1.0)
NEUTROS ABS: 5.2 10*3/uL (ref 1.4–7.7)
NEUTROS PCT: 69.7 % (ref 43.0–77.0)
PLATELETS: 268 10*3/uL (ref 150.0–400.0)
RBC: 3.54 Mil/uL — ABNORMAL LOW (ref 4.22–5.81)
RDW: 18.2 % — AB (ref 11.5–15.5)
WBC: 7.4 10*3/uL (ref 4.0–10.5)

## 2016-10-01 NOTE — Progress Notes (Signed)
HPI :  70 year old male, known to Dr. Ardis Hughs, new patient to me, here for follow-up after recent hospitalization. His medical history as outlined below. He was diagnosed with a left lower extremity DVT on October 21 and placed on Xarelto at that time. He was admitted to the hospital a few weeks later with symptomatic anemia and symptoms of melena x 1. In light of his severe anemia he developed demand related non-ST elevation MI. Troponin peaked at 11 and downturned threes. He received blood transfusions, Xarelto was was held. Dr. Ardis Hughs subsequently performed EGD and colonoscopy as outlined below.  EGD 09/24/2016 - 3 gastric AVMs, treated with cautery Colonoscopy 09/25/2016 - negative CT abdomen without contrast was unremarkable  He left the hospital on 11/10 AMA after the colonoscopy. He did not undergo cardiac workup as recommended. Echo done on 11/8 showed EF of 55 to 60%.   He reports he is not taking the Xarelto at present, he has held it since discharge. He folllows with Dr. Vincente Liberty, primary care, whom he has not yet seen in follow up. He is not seeing any blood in the stools since stopping Xarelto. He reports he is having normal brown stools. He reports he continues to have swelling in his left leg from the DVT. He reports this is his first blood clot, no history of PE. He denies any follow up with cardiology that is scheduled. He takes an aspirin every day. He reports taking both naproxen and Mobic for chronic pain in his leg where the DVT is. He denies chest pains or shortness of breath, otherwise feels well.     Past Medical History:  Diagnosis Date  . Acute MI   . Anemia of chronic disease   . COPD (chronic obstructive pulmonary disease) ([place])   . Diabetes mellitus   . Diabetic neuropathy ([place])   . Diverticulosis   . DVT (deep venous thrombosis) ([facility])    on Xarelto  . Hyperlipidemia   . Hypertension   . Leukocytosis   . Stroke [facility])      Past Surgical History:    Procedure Laterality Date  . COLONOSCOPY WITH PROPOFOL N/A 09/25/2016   Procedure: COLONOSCOPY WITH PROPOFOL;  Surgeon: Milus Banister, MD;  Location: [facility];  Service: Endoscopy;  Laterality: N/A;  . ESOPHAGOGASTRODUODENOSCOPY N/A 09/24/2016   Procedure: ESOPHAGOGASTRODUODENOSCOPY (EGD);  Surgeon: Milus Banister, MD;  Location: [place];  Service: Endoscopy;  Laterality: N/A;  . EYE SURGERY     Family History  Problem Relation Age of Onset  . Bone cancer Mother   . Heart disease Father   . Diabetes Sister    Social History  Substance Use Topics  . Smoking status: Current Some Day Smoker    Packs/day: 0.50    Years: 45.00  . Smokeless tobacco: Current User     Comment: stopped smoking during hospital  . Alcohol use No   Current Outpatient Prescriptions  Medication Sig Dispense Refill  . ALPRAZolam (XANAX) 0.5 MG tablet Take 0.5 mg by mouth 2 (two) times daily.  0  . budesonide-formoterol (SYMBICORT) 80-4.5 MCG/ACT inhaler Inhale 2 puffs into the lungs 2 (two) times daily.    . Choline Fenofibrate (FENOFIBRIC ACID) 135 MG CPDR Take 135 mg by mouth daily.   0  . cilostazol (PLETAL) 50 MG tablet Take 100 mg by mouth 2 (two) times daily.   0  . ezetimibe (ZETIA) 10 MG tablet Take 10 mg by mouth daily.    . fluticasone (  FLONASE) 50 MCG/ACT nasal spray Place 2 sprays into both nostrils 2 (two) times daily.   0  . gabapentin (NEURONTIN) 300 MG capsule Take 300-900 mg by mouth See admin instructions. 300 mg at lunchtime then 600 mg at suppertime then 900 mg at bedtime  0  . HUMALOG MIX 75/25 (75-25) 100 UNIT/ML SUSP injection Inject 30 Units into the skin 2 (two) times daily as needed (CBG >124).   0  . meloxicam (MOBIC) 7.5 MG tablet Take 7.5 mg by mouth 2 (two) times daily.  0  . naproxen (NAPROSYN) 375 MG tablet Take 1 tablet (375 mg total) by mouth 2 (two) times daily. 20 tablet 0  . oxyCODONE-acetaminophen (PERCOCET) 10-325 MG per tablet Take 1 tablet by mouth 5 (five)  times daily.   0  . pioglitazone-metformin (ACTOPLUS MET) 15-850 MG per tablet Take 1 tablet by mouth at bedtime.   0  . Rivaroxaban 15 & 20 MG TBPK Take 15 mg by mouth 2 (two) times daily. Take as directed on package: Start with one 16m tablet by mouth twice a day with food. On Day 22, switch to one 231mtablet once a day with food. 51 each 0  . saxagliptin HCl (ONGLYZA) 5 MG TABS tablet Take 5 mg by mouth daily.    . simvastatin (ZOCOR) 10 MG tablet Take 10 mg by mouth at bedtime.   0  . tamsulosin (FLOMAX) 0.4 MG CAPS capsule Take 0.4 mg by mouth daily.   0  . tiZANidine (ZANAFLEX) 2 MG tablet Take 2 mg by mouth 2 (two) times daily.   0  . traMADol (ULTRAM) 50 MG tablet Take 50 mg by mouth 2 (two) times daily. scheduled  0  . valsartan-hydrochlorothiazide (DIOVAN-HCT) 160-25 MG per tablet Take 1 tablet by mouth daily.   0   No current facility-administered medications for this visit.    Allergies  Allergen Reactions  . Codeine Itching     Review of Systems: All systems reviewed and negative except where noted in HPI.    Ct Abdomen Pelvis Wo Contrast  Result Date: 09/16/2016 CLINICAL DATA:  Fatigue and dyspnea.  Anemia. EXAM: CT ABDOMEN AND PELVIS WITHOUT CONTRAST TECHNIQUE: Multidetector CT imaging of the abdomen and pelvis was performed following the standard protocol without IV contrast. COMPARISON:  None. FINDINGS: Lower chest: No acute abnormality. Hepatobiliary: No focal liver abnormality is seen. No gallstones, gallbladder wall thickening, or biliary dilatation. Pancreas: Unremarkable. No pancreatic ductal dilatation or surrounding inflammatory changes. Spleen: Normal in size without focal abnormality. Adrenals/Urinary Tract: Both adrenals are normal. 2.5 cm benign cyst of the left kidney upper pole anteriorly. Collecting systems and ureters are unremarkable. Bladder is unremarkable. Stomach/Bowel: Stomach is within normal limits. Appendix appears normal. No evidence of bowel wall  thickening, distention, or inflammatory changes. Vascular/Lymphatic: Atherosclerotic calcifications of the aorta. Multiple small indeterminate retroperitoneal and mesenteric nodes, unchanged from 08/01/2013. Reproductive: Prostate is unremarkable. Other: No evidence of retroperitoneal hematoma. Incidental findings include a 5 x 8.5 cm lipoma of the right adductor musculature. This is unchanged. Musculoskeletal: No acute or significant osseous findings. IMPRESSION: 1. No acute findings are evident in the abdomen or pelvis. 2. 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 This does not require follow-up. Electronically Signed   By: DaAndreas Newport.D.   On: 09/16/2016 00:07   Dg Chest Portable 1 View  Result Date: 09/23/2016 CLINICAL DATA:  [age]ear old male with chest pain and shortness of breath today. EXAM: PORTABLE CHEST 1 VIEW COMPARISON:  Chest x-ray 07/11/2015. FINDINGS: There is cephalization of the pulmonary vasculature and slight indistinctness of the interstitial markings suggestive of mild pulmonary edema. Lung volumes are low. No pleural effusions. Heart size is borderline enlarged. Upper mediastinal contours are within normal limits. Atherosclerosis in the thoracic aorta. IMPRESSION: 1. The appearance of the chest suggests developing congestive heart failure, as above. Electronically Signed   By: Vinnie Langton M.D.   On: 09/23/2016 12:59    Lab Results  Component Value Date   TROPONINI 3.26 ([place]) 09/25/2016    Lab Results  Component Value Date   CREATININE 0.86 09/25/2016   BUN 16 09/25/2016   NA 145 09/25/2016   K 3.1 (L) 09/25/2016   CL 113 (H) 09/25/2016   CO2 23 09/25/2016    Lab Results  Component Value Date   ALT 13 (L) 09/23/2016   AST 21 09/23/2016   ALKPHOS 33 (L) 09/23/2016   BILITOT 0.4 09/23/2016     Physical Exam: BP (!) 128/58   Pulse 76   Ht _0  (1.676 m)   Wt 206 lb (93.4 kg)   BMI 33.25 kg/m  Constitutional: Pleasant,well-developed, male  in no acute distress. HEENT: Normocephalic and atraumatic. Conjunctivae are pale. No scleral icterus. Neck supple.  Cardiovascular: Normal rate, regular rhythm.  Pulmonary/chest: Effort normal and breath sounds normal. No wheezing, rales or rhonchi. Abdominal: Soft, nontender, protuberant . There are no masses palpable. No hepatomegaly. Extremities: asymmetric left lower extremity edema compared to right Lymphadenopathy: No cervical adenopathy noted. Neurological: Alert and oriented to person place and time. Skin: Skin is warm and dry. No rashes noted. Psychiatric: Normal mood and affect. Behavior is normal.   ASSESSMENT AND PLAN: 70 year old male recently diagnosed with DVT and placed on Xarelto, after which time he developed severe anemia with an episode of melena, leading to what was thought to be demand related cardiac ischemia with troponin peaking at 11. He underwent endoscopy by Dr. Ardis Hughs, upper endoscopy revealed gastric AVMs which were cauterized which perhaps bled in the setting of Xarelto. It also possible he had small bowel bleeding. Colonoscopy was negative.  Unfortunately after the colonoscopy the patient left the hospital AMA, he did not have a further cardiac evaluation which was recommended by cardiology. While he's been off Xarelto and has had no symptoms of bleeding, his left leg is edematous and painful with known DVT. He otherwise is taking a significant amount of NSAIDs on a daily basis for pain, and told him to stop all NSAIDs other than his aspirin.  Today we're going to repeat his CBC to ensure stable. If the CBC is stable may resume Xarelto for his known DVT in hopes that he does not have any recurrent bleeding, as if this was due to gastric AVMs they have since been treated. The risks of not treating his DVT include progression of DVT and development of PE. The risks of anticoagulation include recurrent bleeding, with anemia causing recurrence of cardiac ischemia. We're  going to call Dr. Elmarie Shiley office to get a follow-up visit with cardiology as soon as possible as cardiac cath is likely needed. We'll also touch base with his primary care regarding DVT management and coronary follow-up with their office.   If he has any symptoms of rebleeding while on Xarelto needs to stop it and go to the hospital given his recent course.   Patient phone [phone]. He agreed with the plan and will call with any questions.   Colcord Cellar, MD [facility] Gastroenterology Pager  [phone]  CC: Vincente Liberty, MD

## 2016-10-01 NOTE — Patient Instructions (Addendum)
You have been given a separate informational sheet regarding your tobacco use, the importance of quitting and local resources to help you quit.  You have been scheduled for an appointment with Dr Elease HashimotoNahser on Tuesday, 10/06/16 @ 4:00 pm. His office is located at [address], [place], [place] [address]  Please DISCONTINUE naprosyn and mobic.  Your physician has requested that you go to the basement for the following lab work before leaving today: CBC (we will call you with results)  Please follow up with Dr Christella HartiganJacobs as needed (depending on upcoming results).  If you are age 69 or older, your body mass index should be between 23-30. Your Body mass index is 33.25 kg/m. If this is out of the aforementioned range listed, please consider follow up with your Primary Care Provider.  If you are age 364 or younger, your body mass index should be between 19-25. Your Body mass index is 33.25 kg/m. If this is out of the aformentioned range listed, please consider follow up with your Primary Care Provider.

## 2016-10-02 ENCOUNTER — Encounter: Payer: Self-pay | Admitting: Cardiovascular Disease

## 2016-10-06 ENCOUNTER — Encounter (INDEPENDENT_AMBULATORY_CARE_PROVIDER_SITE_OTHER): Payer: Self-pay

## 2016-10-06 ENCOUNTER — Encounter: Payer: Self-pay | Admitting: Cardiovascular Disease

## 2016-10-06 ENCOUNTER — Ambulatory Visit (INDEPENDENT_AMBULATORY_CARE_PROVIDER_SITE_OTHER): Payer: Medicare Other | Admitting: Cardiovascular Disease

## 2016-10-06 ENCOUNTER — Telehealth: Payer: Self-pay | Admitting: *Deleted

## 2016-10-06 VITALS — BP 96/50 | HR 75 | Ht 66.0 in | Wt 211.8 lb

## 2016-10-06 DIAGNOSIS — I824Y2 Acute embolism and thrombosis of unspecified deep veins of left proximal lower extremity: Secondary | ICD-10-CM | POA: Diagnosis not present

## 2016-10-06 DIAGNOSIS — I214 Non-ST elevation (NSTEMI) myocardial infarction: Secondary | ICD-10-CM

## 2016-10-06 DIAGNOSIS — I252 Old myocardial infarction: Secondary | ICD-10-CM

## 2016-10-06 HISTORY — DX: Old myocardial infarction: I25.2

## 2016-10-06 LAB — CBC
HEMATOCRIT: 29 % — AB (ref 38.5–50.0)
HEMOGLOBIN: 8.9 g/dL — AB (ref 13.2–17.1)
MCH: 26.6 pg — AB (ref 27.0–33.0)
MCHC: 30.7 g/dL — ABNORMAL LOW (ref 32.0–36.0)
MCV: 86.6 fL (ref 80.0–100.0)
MPV: 9.2 fL (ref 7.5–12.5)
PLATELETS: 332 10*3/uL (ref 140–400)
RBC: 3.35 MIL/uL — AB (ref 4.20–5.80)
RDW: 17.2 % — ABNORMAL HIGH (ref 11.0–15.0)
WBC: 7.4 10*3/uL (ref 3.8–10.8)

## 2016-10-06 LAB — BASIC METABOLIC PANEL
BUN: 19 mg/dL (ref 7–25)
CALCIUM: 8.7 mg/dL (ref 8.6–10.3)
CHLORIDE: 105 mmol/L (ref 98–110)
CO2: 26 mmol/L (ref 20–31)
CREATININE: 0.99 mg/dL (ref 0.70–1.25)
GLUCOSE: 113 mg/dL — AB (ref 65–99)
Potassium: 4.1 mmol/L (ref 3.5–5.3)
Sodium: 138 mmol/L (ref 135–146)

## 2016-10-06 NOTE — Telephone Encounter (Signed)
Patient was given one bottle of xarelto samples. He will get further samples from his pcp.

## 2016-10-06 NOTE — Patient Instructions (Addendum)
Medication Instructions:  TAKE Xarelto 20 mg once daily (sample given) Your medical doctor will need to prescribe this for your DVT   Labwork: TODAY - CBC, basic metabolic panel  Your physician recommends that you return for lab work in: 2 months for CBC, BMET at office visit with PA/NP   Testing/Procedures: None Ordered   Follow-Up: Your physician recommends that you schedule a follow-up appointment in: 2 months with PA/NP   If you need a refill on your cardiac medications before your next appointment, please call your pharmacy.   Thank you for choosing [facility]! Eligha BridegroomMichelle Texie Tupou, RN [phone]

## 2016-10-06 NOTE — Progress Notes (Signed)
Cardiology Office Note   Date:  10/06/2016   ID:  Gregory Walker, DOB 09-18-1946, MRN [id number]  PCP:  Leola Brazil, MD  Cardiologist:   Mertie Moores, MD   Chief Complaint  Patient presents with  . follow up to ED   Problem list 1. Severe anemia - 2. Hypertension 3. Diabetes mellitus 4. COPD 5. History of stroke 6. History of DVT  7. NSTEMI - in the setting of anemia - Hb of 5.7   Gregory Walker is a 70 y.o. male who presents for follow-up of her recent hospitalization.  I met Gregory Walker  or on November 8 when he presented to the emergency room after having a respiratory arrest. He was walking to his dog kennel's and got her family we can start having severe shortness of breath and chest pain. He presented to the emergency room and was found to have a hemoglobin of 5.7.  He was in florid pulmonary edema. He was coughing up pink frothy sputum.  He was transfused several units of blood. His hemoglobin the day following admission was 7.8. His troponin peaked at 11.89.  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 These were cauterized. The patient left AMA ( again!!)  the day following his EGD procedure.  Echocardiogram on November 8 reveals normal left ventricle systolic function with an ejection fraction of 55-60%. He had normal left ventricular diastolic function. He had mild aortic stenosis with a mean aortic valve gradient of 12 mmHg.  Moderate pulmonary hypertension with an estimated PA pressure of 44 mmHg.  He left the hospital AMA again ( 2nd time this month)  Said that no one was there to let out his dogs.   Breathing is ok Still smoking 1/2 ppd  - ppd    Past Medical History:  Diagnosis Date  . Acute MI   . Anemia of chronic disease   . COPD (chronic obstructive pulmonary disease) ([facility])   . Diabetes mellitus   . Diabetic neuropathy ([place])   . Diverticulosis   . DVT (deep venous thrombosis) ([facility])    on Xarelto  .  Hyperlipidemia   . Hypertension   . Leukocytosis   . Stroke [facility])     Past Surgical History:  Procedure Laterality Date  . COLONOSCOPY WITH PROPOFOL N/A 09/25/2016   Procedure: COLONOSCOPY WITH PROPOFOL;  Surgeon: Milus Banister, MD;  Location: [place];  Service: Endoscopy;  Laterality: N/A;  . ESOPHAGOGASTRODUODENOSCOPY N/A 09/24/2016   Procedure: ESOPHAGOGASTRODUODENOSCOPY (EGD);  Surgeon: Milus Banister, MD;  Location: [place];  Service: Endoscopy;  Laterality: N/A;  . EYE SURGERY       Current Outpatient Prescriptions  Medication Sig Dispense Refill  . ALPRAZolam (XANAX) 0.5 MG tablet Take 0.5 mg by mouth 2 (two) times daily.  0  . budesonide-formoterol (SYMBICORT) 80-4.5 MCG/ACT inhaler Inhale 2 puffs into the lungs 2 (two) times daily.    . Choline Fenofibrate (FENOFIBRIC ACID) 135 MG CPDR Take 135 mg by mouth daily.   0  . cilostazol (PLETAL) 50 MG tablet Take 100 mg by mouth 2 (two) times daily.   0  . ezetimibe (ZETIA) 10 MG tablet Take 10 mg by mouth daily.    . fluticasone (FLONASE) 50 MCG/ACT nasal spray Place 2 sprays into both nostrils 2 (two) times daily.   0  . gabapentin (NEURONTIN) 300 MG capsule Take 300-900 mg by mouth See admin instructions. 300 mg at lunchtime then 600 mg at suppertime then  900 mg at bedtime  0  . HUMALOG MIX 75/25 (75-25) 100 UNIT/ML SUSP injection Inject 30 Units into the skin 2 (two) times daily as needed (CBG >124).   0  . meloxicam (MOBIC) 7.5 MG tablet Take 7.5 mg by mouth daily.    . naproxen (NAPROSYN) 375 MG tablet Take 1 tablet (375 mg total) by mouth 2 (two) times daily. 20 tablet 0  . oxyCODONE-acetaminophen (PERCOCET) 10-325 MG per tablet Take 1 tablet by mouth 5 (five) times daily.   0  . pioglitazone-metformin (ACTOPLUS MET) 15-850 MG per tablet Take 1 tablet by mouth at bedtime.   0  . Rivaroxaban 15 & 20 MG TBPK Take 15 mg by mouth 2 (two) times daily. Take as directed on package: Start with one 46m tablet by mouth  twice a day with food. On Day 22, switch to one 268mtablet once a day with food. 51 each 0  . saxagliptin HCl (ONGLYZA) 5 MG TABS tablet Take 5 mg by mouth daily.    . simvastatin (ZOCOR) 10 MG tablet Take 10 mg by mouth at bedtime.   0  . tamsulosin (FLOMAX) 0.4 MG CAPS capsule Take 0.4 mg by mouth daily.   0  . tiZANidine (ZANAFLEX) 2 MG tablet Take 2 mg by mouth 2 (two) times daily.   0  . traMADol (ULTRAM) 50 MG tablet Take 50 mg by mouth 2 (two) times daily. scheduled  0  . valsartan-hydrochlorothiazide (DIOVAN-HCT) 160-25 MG per tablet Take 1 tablet by mouth daily.   0   No current facility-administered medications for this visit.     Allergies:   Codeine    Social History:  The patient  reports that he has been smoking.  He has a 22.50 pack-year smoking history. He uses smokeless tobacco. He reports that he does not drink alcohol or use drugs.   Family History:  The patient's family history includes Bone cancer in his mother; Diabetes in his sister; Heart disease in his father.    ROS:  Please see the history of present illness.    Review of Systems: Constitutional:  denies fever, chills, diaphoresis, appetite change and fatigue.  HEENT: denies photophobia, eye pain, redness, hearing loss, ear pain, congestion, sore throat, rhinorrhea, sneezing, neck pain, neck stiffness and tinnitus.  Respiratory: denies SOB, DOE, cough, chest tightness, and wheezing.  Cardiovascular: denies chest pain, palpitations and leg swelling.  Gastrointestinal: denies nausea, vomiting, abdominal pain, diarrhea, constipation, blood in stool.  Genitourinary: denies dysuria, urgency, frequency, hematuria, flank pain and difficulty urinating.  Musculoskeletal: denies  myalgias, back pain, joint swelling, arthralgias and gait problem.   Skin: denies pallor, rash and wound.  Neurological: denies dizziness, seizures, syncope, weakness, light-headedness, numbness and headaches.   Hematological: denies  adenopathy, easy bruising, personal or family bleeding history.  Psychiatric/ Behavioral: denies suicidal ideation, mood changes, confusion, nervousness, sleep disturbance and agitation.       All other systems are reviewed and negative.    PHYSICAL EXAM: VS:  BP (!) 96/50   Pulse 75   Ht 5' 6"  (1.676 m)   Wt 211 lb 12.8 oz (96.1 kg)   SpO2 95%   BMI 34.19 kg/m  , BMI Body mass index is 34.19 kg/m. GEN: Well nourished, well developed, in no acute distress  HEENT: normal  Neck: no JVD, carotid bruits, or masses Cardiac: RRR; no murmurs, rubs, or gallops, 1-2 + left leg edema   Respiratory:  clear to auscultation bilaterally, normal work  of breathing GI: soft, nontender, nondistended, + BS MS: no deformity or atrophy  Skin: warm and dry, no rash Neuro:  Strength and sensation are intact Psych: normal   EKG:  EKG is not ordered today. The ekg ordered today demonstrates    Recent Labs: 09/23/2016: ALT 13; B Natriuretic Peptide 133.5 09/25/2016: BUN 16; Creatinine, Ser 0.86; Magnesium 1.9; Potassium 3.1; Sodium 145 10/01/2016: Hemoglobin 9.5; Platelets 268.0    Lipid Panel    Component Value Date/Time   CHOL 120 09/24/2016 0750   TRIG 102 09/24/2016 0750   HDL 31 (L) 09/24/2016 0750   CHOLHDL 3.9 09/24/2016 0750   VLDL 20 09/24/2016 0750   LDLCALC 69 09/24/2016 0750      Wt Readings from Last 3 Encounters:  10/06/16 211 lb 12.8 oz (96.1 kg)  10/01/16 206 lb (93.4 kg)  09/25/16 203 lb 14.8 oz (92.5 kg)      Other studies Reviewed: Additional studies/ records that were reviewed today include: . Review of the above records demonstrates:    ASSESSMENT AND PLAN:  1.  DVT - had a near life threatening GI bleed on Xarelto 15 BID .      He is now off the Xarelto due to this bleed. He was found have 3 small AV malformations that were bleeding. These were cauterized.   Sees Dr. Katherine Roan .  He will need to follow up with his medical doctor.  He has already  restarted the Xarelto on his own. He ran out just today.  We'll see if we can give him 1 week of Xarelto samples. I'll let his primary medical doctor manage his DVT going forward.  2.  Non-ST segment elevation myocardial infarction:  Center had a non-ST segment elevation myocardial infarction in the setting of profound anemia and congestive heart failure. He did not have any chest pain the day following his procedure. He does at some point need a cardiac catheterization. His anemia is gradually improving. He has already restarted his Xarelto on his own.   We'll have him return in 2 months for a follow-up office visit with our 73 assistant. We'll get a CBC and basic metabolic profile at that time. It is labs are fairly stable and his creatinine has improved we will consider cardiac catheterization at that time. Fortunately, he is not having any episodes of angina and no further signs of congestive heart failure. It is quite possible that his NSTEMI and congestive heart failure were due to his profound anemia.  3.. GI bleed - he was found have a male from patient's. 3 of these were cauterized during his last hospitalization. He has seen Dr. Jolly Mango for follow up and has been cleared to restart his Xarelto - he had already started the Xarelto on his own  4. COPD - still smokes  5. Pulmonary HTN - his estimate PA pressures of 40. This is most likely due to his COPD and ongoing cigarette smoking. He has mild leg edema ,  at advised him to stop smoking. Current medicines are reviewed at length with the patient today.  The patient has concerns regarding medicines.  Labs/ tests ordered today include:  No orders of the defined types were placed in this encounter.    Disposition:   FU with [facility] PA  in 2 months      Mertie Moores, MD  10/06/2016 4:11 PM    [facility] [place], [place], [place]  [address] Phone: [phone])  [phone]; Fax: [phone]

## 2016-11-03 ENCOUNTER — Ambulatory Visit
Admission: RE | Admit: 2016-11-03 | Discharge: 2016-11-03 | Disposition: A | Discharge status: Home / Self Care | Payer: Medicare Other | Source: Ambulatory Visit | Attending: Pulmonary Disease | Admitting: Pulmonary Disease

## 2016-11-03 ENCOUNTER — Other Ambulatory Visit: Payer: Self-pay | Admitting: Pulmonary Disease

## 2016-11-03 DIAGNOSIS — R042 Hemoptysis: Secondary | ICD-10-CM

## 2016-12-07 ENCOUNTER — Ambulatory Visit (INDEPENDENT_AMBULATORY_CARE_PROVIDER_SITE_OTHER): Payer: Medicare Other | Admitting: Nurse Practitioner

## 2016-12-07 ENCOUNTER — Encounter: Payer: Self-pay | Admitting: Nurse Practitioner

## 2016-12-07 VITALS — BP 138/64 | HR 82 | Ht 66.0 in | Wt 214.8 lb

## 2016-12-07 DIAGNOSIS — D649 Anemia, unspecified: Secondary | ICD-10-CM

## 2016-12-07 DIAGNOSIS — I214 Non-ST elevation (NSTEMI) myocardial infarction: Secondary | ICD-10-CM

## 2016-12-07 DIAGNOSIS — I824Y2 Acute embolism and thrombosis of unspecified deep veins of left proximal lower extremity: Secondary | ICD-10-CM

## 2016-12-07 NOTE — Patient Instructions (Addendum)
We will be checking the following labs today - BMET and CBC   Medication Instructions:    Continue with your current medicines.     Testing/Procedures To Be Arranged:  Lexiscan Myoview  Follow-Up:   See Dr. Elease HashimotoNahser in about 3 to 4 months.     Other Special Instructions:   N/A    If you need a refill on your cardiac medications before your next appointment, please call your pharmacy.   Call the [facility] office at [phone] if you have any questions, problems or concerns.

## 2016-12-07 NOTE — Progress Notes (Addendum)
CARDIOLOGY OFFICE NOTE  Date:  12/07/2016    Gregory Walker Date of Birth: October 12, 1946 Medical Record #[id number]  PCP:  Leola Brazil, MD  Cardiologist:  Nahser  Chief Complaint  Patient presents with  . Follow-up    2 month check - seen for Dr. Acie Fredrickson    History of Present Illness: Gregory Walker is a 71 y.o. male who presents today for a follow up visit. Seen for Dr. Acie Fredrickson.   He has a history of severe anemia which has resulted in a respiratory arrest and NSTEMI. Other issues include HTN, DM, COPD, prior stroke, & prior DVT (October 2017). He has ongoing tobacco abuse.   Seen back in November after the admission for profound anemia with subsequent respiratory arrest/NSTEMI.  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 These were cauterized.  He left AMA - this has been a recurrent theme for him.   Echocardiogram on November 8 reveals normal left ventricle systolic function with an ejection fraction of 55-60%. He had normal left ventricular diastolic function. He had mild aortic stenosis with a mean aortic valve gradient of 12 mmHg.  Moderate pulmonary hypertension with an estimated PA pressure of 44 mmHg.  Last seen back in November by Dr. Acie Fredrickson. His anemia/anticoagulation was deferred to his PCP. Noted that if his labs improved - cardiac catheterization would be recommended in light of the recent NSTEMI.   Comes in today. Here alone. He has no idea about his medicines. His reasoning is "no education". His girlfriend died in his arms back in Jun 19, 2023. His Xarelto was on his med list but he has no idea whether he is on it or not. He then tells me it is something else but he does not know the name of it. Some stable DOE. No chest pain. Smoking less. Not really interested in having a heart cath - has no one to take care of his dogs. Does not wish to go back to the hospital. No recent labs that I see.   Past Medical History:  Diagnosis Date  . Acute MI     . Anemia of chronic disease   . COPD (chronic obstructive pulmonary disease) ([place])   . Diabetes mellitus   . Diabetic neuropathy ([place])   . Diverticulosis   . DVT (deep venous thrombosis) ([facility])    on Xarelto  . Hyperlipidemia   . Hypertension   . Leukocytosis   . Stroke [facility])     Past Surgical History:  Procedure Laterality Date  . COLONOSCOPY WITH PROPOFOL N/A 09/25/2016   Procedure: COLONOSCOPY WITH PROPOFOL;  Surgeon: Milus Banister, MD;  Location: [place];  Service: Endoscopy;  Laterality: N/A;  . ESOPHAGOGASTRODUODENOSCOPY N/A 09/24/2016   Procedure: ESOPHAGOGASTRODUODENOSCOPY (EGD);  Surgeon: Milus Banister, MD;  Location: [facility];  Service: Endoscopy;  Laterality: N/A;  . EYE SURGERY       Medications: Current Outpatient Prescriptions  Medication Sig Dispense Refill  . ALPRAZolam (XANAX) 0.5 MG tablet Take 0.5 mg by mouth 2 (two) times daily.  0  . budesonide-formoterol (SYMBICORT) 80-4.5 MCG/ACT inhaler Inhale 2 puffs into the lungs 2 (two) times daily.    . Choline Fenofibrate (FENOFIBRIC ACID) 135 MG CPDR Take 135 mg by mouth daily.   0  . cilostazol (PLETAL) 50 MG tablet Take 100 mg by mouth 2 (two) times daily.   0  . ezetimibe (ZETIA) 10 MG tablet Take 10 mg by mouth daily.    . fluticasone (  FLONASE) 50 MCG/ACT nasal spray Place 2 sprays into both nostrils 2 (two) times daily.   0  . gabapentin (NEURONTIN) 300 MG capsule Take 300-900 mg by mouth See admin instructions. 300 mg at lunchtime then 600 mg at suppertime then 900 mg at bedtime  0  . HUMALOG MIX 75/25 (75-25) 100 UNIT/ML SUSP injection Inject 30 Units into the skin 2 (two) times daily as needed (CBG >124).   0  . meloxicam (MOBIC) 7.5 MG tablet Take 7.5 mg by mouth daily.    . naproxen (NAPROSYN) 375 MG tablet Take 1 tablet (375 mg total) by mouth 2 (two) times daily. 20 tablet 0  . oxyCODONE-acetaminophen (PERCOCET) 10-325 MG per tablet Take 1 tablet by mouth 5 (five) times daily.   0  .  pioglitazone-metformin (ACTOPLUS MET) 15-850 MG per tablet Take 1 tablet by mouth at bedtime.   0  . saxagliptin HCl (ONGLYZA) 5 MG TABS tablet Take 5 mg by mouth daily.    . simvastatin (ZOCOR) 10 MG tablet Take 10 mg by mouth at bedtime.   0  . tamsulosin (FLOMAX) 0.4 MG CAPS capsule Take 0.4 mg by mouth daily.   0  . tiZANidine (ZANAFLEX) 2 MG tablet Take 2 mg by mouth 2 (two) times daily.   0  . traMADol (ULTRAM) 50 MG tablet Take 50 mg by mouth 2 (two) times daily. scheduled  0  . valsartan-hydrochlorothiazide (DIOVAN-HCT) 160-25 MG per tablet Take 1 tablet by mouth daily.   0   No current facility-administered medications for this visit.     Allergies: Allergies  Allergen Reactions  . Codeine Itching    Social History: The patient  reports that he has been smoking.  He has a 22.50 pack-year smoking history. His smokeless tobacco use includes Chew. He reports that he does not drink alcohol or use drugs.   Family History: The patient's family history includes Bone cancer in his mother; Diabetes in his sister; Heart disease in his father.   Review of Systems: Please see the history of present illness.   Otherwise, the review of systems is positive for none.   All other systems are reviewed and negative.   Physical Exam: VS:  BP 138/64   Pulse 82   Ht 5' 6"  (1.676 m)   Wt 214 lb 12.8 oz (97.4 kg)   BMI 34.67 kg/m  .  BMI Body mass index is 34.67 kg/m.  Wt Readings from Last 3 Encounters:  12/07/16 214 lb 12.8 oz (97.4 kg)  10/06/16 211 lb 12.8 oz (96.1 kg)  10/01/16 206 lb (93.4 kg)    General: Pleasant. He is alert and in no acute distress.   HEENT: Normal.  Neck: Supple, no JVD, carotid bruits, or masses noted.  Cardiac: Regular rate and rhythm. No murmurs, rubs, or gallops. No edema.  Respiratory:  Lungs are clear to auscultation bilaterally with normal work of breathing.  GI: Soft and nontender.  MS: No deformity or atrophy. Gait and ROM intact.  Skin: Warm and  dry. Color is normal.  Neuro:  Strength and sensation are intact and no gross focal deficits noted.  Psych: Alert, appropriate and with normal affect.   LABORATORY DATA:  EKG:  EKG is ordered today. This demonstrates NSR. Reviewed with Dr. Acie Fredrickson.   Lab Results  Component Value Date   WBC 7.4 10/06/2016   HGB 8.9 (L) 10/06/2016   HCT 29.0 (L) 10/06/2016   PLT 332 10/06/2016   GLUCOSE 113 (H)  10/06/2016   CHOL 120 09/24/2016   TRIG 102 09/24/2016   HDL 31 (L) 09/24/2016   LDLCALC 69 09/24/2016   ALT 13 (L) 09/23/2016   AST 21 09/23/2016   NA 138 10/06/2016   K 4.1 10/06/2016   CL 105 10/06/2016   CREATININE 0.99 10/06/2016   BUN 19 10/06/2016   CO2 26 10/06/2016   TSH 0.788 06/09/2015   INR 1.04 09/24/2016   HGBA1C 5.2 09/24/2016    BNP (last 3 results)  Recent Labs  09/23/16 1227  BNP 133.5*    ProBNP (last 3 results) No results for input(s): PROBNP in the last 8760 hours.   Other Studies Reviewed Today:  Echo Study Conclusions from 09/2016  - Left ventricle: The cavity size was mildly dilated. There was   moderate focal basal hypertrophy of the septum. Systolic function   was normal. The estimated ejection fraction was in the range of   55% to 60%. Wall motion was normal; there were no regional wall   motion abnormalities. Left ventricular diastolic function   parameters were normal. - Aortic valve: There was mild stenosis. There was no   regurgitation. Peak velocity (S): 229 cm/s. Mean gradient (S): 12   mm Hg. Valve area (VTI): 2.67 cm^2. Valve area (Vmax): 2.46 cm^2.   Valve area (Vmean): 2.41 cm^2. - Mitral valve: Transvalvular velocity was within the normal range.   There was no evidence for stenosis. There was mild regurgitation. - Left atrium: The atrium was severely dilated. - Right ventricle: The cavity size was normal. Wall thickness was   normal. Systolic function was normal. - Tricuspid valve: There was mild regurgitation. - Pulmonary  arteries: Systolic pressure was increased. PA peak   pressure: 40 mm Hg (S).   Assessment/Plan: 1.  DVT - had a recent near life threatening GI bleed previously on Xarelto 15 BID and required multiple transfusions - found to have 3 small AVM's that were cauterized - it is NOT clear what his medicines are - specifically his anticoagulation and PCP was to be managing his DVT going forward.   2.  Non-ST segment elevation myocardial infarction:  This occurred in the setting of profound anemia and congestive heart failure. He did not have any chest pain the day following his procedure. He does not have chest pain now. Discussed with Dr. Acie Fredrickson - I think given his current situation and issues with compliance would be best to proceed with [facility] and make sure he does not have a high risk Myoview. He does not wish to go to the hospital and will basically just leave if he is put back there. I think his uncertainty regarding the anticoagulation is very concerning - would not want to add DAPT to this as well. He is basically asymptomatic.   3. COPD - still smokes - seems to be less.   4. Pulmonary HTN - his estimate PA pressures of 40. This is most likely due to his COPD and ongoing cigarette smoking.   5. HTN - BP ok on current regimen. Again, not clear what he is taking.   6. DM  Current medicines are reviewed at length with the patient today.  The patient has concerns regarding medicines.   Current medicines are reviewed with the patient today.  The patient does not have concerns regarding medicines other than what has been noted above.  The following changes have been made:  See above.  Labs/ tests ordered today include:    Orders Placed  This Encounter  Procedures  . Basic metabolic panel  . CBC  . Myocardial Perfusion Imaging  . EKG 12-Lead     Disposition:   FU with Dr. Acie Fredrickson in 3 to 4 months. Labs today. Arranging for [employer].    Patient is agreeable to this  plan and will call if any problems develop in the interim.   SignedTruitt Merle, NP  12/07/2016 4:15 PM  [facility] [address] [place], [place]  [address] Phone: [phone] Fax: [phone]      Addendum: 12/08/16 Have been able to verify med list with patient's daughter. He is only on aspirin therapy 81 mg daily. No longer on any type of other anticoagulation. He is more anemic. His daughter has been advised that he needs to see his PCP for the worsening anemia.   Burtis Junes, RN, [facility] [address] [place], [place]  [address] [phone]

## 2016-12-08 ENCOUNTER — Other Ambulatory Visit: Payer: Self-pay | Admitting: *Deleted

## 2016-12-08 LAB — BASIC METABOLIC PANEL
BUN/Creatinine Ratio: 22 (ref 10–24)
BUN: 25 mg/dL (ref 8–27)
CO2: 24 mmol/L (ref 18–29)
Calcium: 9.3 mg/dL (ref 8.6–10.2)
Chloride: 102 mmol/L (ref 96–106)
Creatinine, Ser: 1.14 mg/dL (ref 0.76–1.27)
GFR calc Af Amer: 75 mL/min/{1.73_m2} (ref >59)
GFR calc non Af Amer: 65 mL/min/{1.73_m2} (ref >59)
Glucose: 98 mg/dL (ref 65–99)
Potassium: 3.9 mmol/L (ref 3.5–5.2)
Sodium: 143 mmol/L (ref 134–144)

## 2016-12-08 LAB — CBC
Hematocrit: 26.2 % — ABNORMAL LOW (ref 37.5–51.0)
Hemoglobin: 8.1 g/dL — ABNORMAL LOW (ref 13.0–17.7)
MCH: 23.7 pg — ABNORMAL LOW (ref 26.6–33.0)
MCHC: 30.9 g/dL — ABNORMAL LOW (ref 31.5–35.7)
MCV: 77 fL — ABNORMAL LOW (ref 79–97)
Platelets: 428 10*3/uL — ABNORMAL HIGH (ref 150–379)
RBC: 3.42 x10E6/uL — ABNORMAL LOW (ref 4.14–5.80)
RDW: 17.6 % — ABNORMAL HIGH (ref 12.3–15.4)
WBC: 8.5 10*3/uL (ref 3.4–10.8)

## 2016-12-08 MED ORDER — FUROSEMIDE 40 MG PO TABS: 40.0000 mg | ORAL_TABLET | Freq: Every day | ORAL | 3 refills | Status: AC

## 2016-12-08 MED ORDER — ASPIRIN EC 81 MG PO TBEC: 81.0000 mg | DELAYED_RELEASE_TABLET | Freq: Every day | ORAL | 3 refills | Status: AC

## 2016-12-08 MED ORDER — TRAMADOL HCL 50 MG PO TABS: 50.0000 mg | ORAL_TABLET | Freq: Every day | ORAL | 0 refills | Status: DC

## 2016-12-09 ENCOUNTER — Telehealth (HOSPITAL_COMMUNITY): Payer: Self-pay | Admitting: *Deleted

## 2016-12-09 NOTE — Telephone Encounter (Signed)
Attempted to call patient regarding upcoming appointment, No Answer x 3.  Attempted to call daughter per DPR- No Answer.   Gregory Walker, Paarth Cropper Jacqueline

## 2016-12-11 ENCOUNTER — Ambulatory Visit (HOSPITAL_COMMUNITY): Payer: Medicare Other | Attending: Cardiovascular Disease

## 2016-12-11 DIAGNOSIS — I214 Non-ST elevation (NSTEMI) myocardial infarction: Secondary | ICD-10-CM | POA: Diagnosis present

## 2016-12-11 DIAGNOSIS — D649 Anemia, unspecified: Secondary | ICD-10-CM | POA: Diagnosis not present

## 2016-12-11 DIAGNOSIS — I824Y2 Acute embolism and thrombosis of unspecified deep veins of left proximal lower extremity: Secondary | ICD-10-CM

## 2016-12-11 LAB — MYOCARDIAL PERFUSION IMAGING
LV dias vol: 153 mL (ref 62–150)
LV sys vol: 63 mL
Peak HR: 93 {beats}/min
RATE: 0.33
Rest HR: 68 {beats}/min
SDS: 0
SRS: 0
SSS: 0
TID: 1.03

## 2016-12-11 MED ORDER — TECHNETIUM TC 99M TETROFOSMIN IV KIT
10.2000 | PACK | Freq: Once | INTRAVENOUS | Status: AC | PRN
  Administered 2016-12-11: 10.2 via INTRAVENOUS
  Filled 2016-12-11: qty 11

## 2016-12-11 MED ORDER — REGADENOSON 0.4 MG/5ML IV SOLN
0.4000 mg | Freq: Once | INTRAVENOUS | Status: AC
  Administered 2016-12-11: 0.4 mg via INTRAVENOUS

## 2016-12-11 MED ORDER — TECHNETIUM TC 99M TETROFOSMIN IV KIT
32.6000 | PACK | Freq: Once | INTRAVENOUS | Status: AC | PRN
  Administered 2016-12-11: 32.6 via INTRAVENOUS
  Filled 2016-12-11: qty 33

## 2017-02-08 ENCOUNTER — Other Ambulatory Visit (HOSPITAL_COMMUNITY): Payer: Self-pay | Admitting: Pulmonary Disease

## 2017-02-08 DIAGNOSIS — R52 Pain, unspecified: Secondary | ICD-10-CM

## 2017-02-11 ENCOUNTER — Ambulatory Visit (HOSPITAL_COMMUNITY)
Admission: RE | Admit: 2017-02-11 | Discharge: 2017-02-11 | Disposition: A | Discharge status: Home / Self Care | Payer: Medicare Other | Source: Ambulatory Visit | Attending: Pulmonary Disease | Admitting: Pulmonary Disease

## 2017-02-11 DIAGNOSIS — R52 Pain, unspecified: Secondary | ICD-10-CM | POA: Diagnosis not present

## 2017-02-11 DIAGNOSIS — I82503 Chronic embolism and thrombosis of unspecified deep veins of lower extremity, bilateral: Secondary | ICD-10-CM | POA: Insufficient documentation

## 2017-02-11 NOTE — Progress Notes (Signed)
**  Preliminary report by tech**  Bilateral lower extremity venous duplex completed. There is no evidence of deep or superficial vein thrombosis involving the right and left lower extremities. All visualized vessels appear patent and compressible. There is no evidence of Baker's cysts bilaterally. Incidental findings are consistent with: enlarged lymph node measuring 0.7 cm high by 1.9 wide by 4.1 cm long on the right. Results given to Evie at Dr. Consuello BossierKilpatrick's office.  02/11/17 11:50 AM Olen CordialGreg Lania Zawistowski RVT

## 2017-03-10 ENCOUNTER — Ambulatory Visit (INDEPENDENT_AMBULATORY_CARE_PROVIDER_SITE_OTHER): Payer: Medicare Other | Admitting: Cardiovascular Disease

## 2017-03-10 ENCOUNTER — Encounter: Payer: Self-pay | Admitting: Cardiovascular Disease

## 2017-03-10 ENCOUNTER — Encounter (INDEPENDENT_AMBULATORY_CARE_PROVIDER_SITE_OTHER): Payer: Self-pay

## 2017-03-10 VITALS — BP 122/68 | HR 96 | Ht 66.0 in | Wt 214.8 lb

## 2017-03-10 DIAGNOSIS — I214 Non-ST elevation (NSTEMI) myocardial infarction: Secondary | ICD-10-CM | POA: Diagnosis not present

## 2017-03-10 DIAGNOSIS — I82432 Acute embolism and thrombosis of left popliteal vein: Secondary | ICD-10-CM

## 2017-03-10 NOTE — Patient Instructions (Signed)
Medication Instructions:  Your physician recommends that you continue on your current medications as directed. Please refer to the Current Medication list given to you today.   Labwork: None Ordered   Testing/Procedures: None Ordered   Follow-Up: Your physician wants you to follow-up in: 6 months with Dr. Harvie Bridge PA.  You will receive a reminder letter in the mail two months in advance. If you don't receive a letter, please call our office to schedule the follow-up appointment.   If you need a refill on your cardiac medications before your next appointment, please call your pharmacy.   Thank you for choosing [facility]! Eligha Bridegroom, RN [phone]

## 2017-03-10 NOTE — Progress Notes (Signed)
Cardiology Office Note   Date:  03/10/2017   ID:  Gregory Walker, DOB June 13, 1946, MRN [id number]  PCP:  Gregory Brazil, MD  Cardiologist:   Gregory Moores, MD   Chief Complaint  Patient presents with  . Coronary Artery Disease   Problem list 1. Severe anemia - 2. Hypertension 3. Diabetes mellitus 4. Pulmonary hypertension  - moderate  5. COPD 6. History of stroke 7. History of DVT 8.  . NSTEMI - in the setting of anemia - Hb of 5.7   Gregory Walker is a 71 y.o. male who presents for follow-up of her recent hospitalization.  I met Gregory Walker  or on November 8 when he presented to the emergency room after having a respiratory arrest. He was walking to his dog kennel's and started having severe shortness of breath and chest pain. He presented to the emergency room and was found to have a hemoglobin of 5.7.  He was in florid pulmonary edema. He was coughing up pink frothy sputum.  He was transfused several units of blood. His hemoglobin the day following admission was 7.8. His troponin peaked at 11.89.  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 These were cauterized. The patient left AMA ( again!!)  the day following his EGD procedure.  Echocardiogram on November 8 reveals normal left ventricle systolic function with an ejection fraction of 55-60%. He had normal left ventricular diastolic function. He had mild aortic stenosis with a mean aortic valve gradient of 12 mmHg.  Moderate pulmonary hypertension with an estimated PA pressure of 44 mmHg.  He left the hospital AMA again ( 2nd time this month)  Said that no one was there to let out his dogs.   Breathing is ok Still smoking 1/2 ppd  - ppd   March 10, 2017:  Gregory Walker is seen back today for follow up visit  Hx of NSTEMI in the setting of severe anemia .  Says he stopped smoking - still smells strongly of smoke  Still seeing Dr. Katherine Roan .  Had a venous duplex - no evidence of DVT  No CP ,  no dyspnea    Past Medical History:  Diagnosis Date  . Acute MI ([place])   . Anemia of chronic disease   . COPD (chronic obstructive pulmonary disease) ([place])   . Diabetes mellitus   . Diabetic neuropathy ([place])   . Diverticulosis   . DVT (deep venous thrombosis) ([facility])    on Xarelto  . Hyperlipidemia   . Hypertension   . Leukocytosis   . Stroke [facility])     Past Surgical History:  Procedure Laterality Date  . COLONOSCOPY WITH PROPOFOL N/A 09/25/2016   Procedure: COLONOSCOPY WITH PROPOFOL;  Surgeon: Milus Banister, MD;  Location: [place];  Service: Endoscopy;  Laterality: N/A;  . ESOPHAGOGASTRODUODENOSCOPY N/A 09/24/2016   Procedure: ESOPHAGOGASTRODUODENOSCOPY (EGD);  Surgeon: Milus Banister, MD;  Location: [place];  Service: Endoscopy;  Laterality: N/A;  . EYE SURGERY       Current Outpatient Prescriptions  Medication Sig Dispense Refill  . ALPRAZolam (XANAX) 0.5 MG tablet Take 0.5 mg by mouth 2 (two) times daily.  0  . aspirin EC 81 MG tablet Take 1 tablet (81 mg total) by mouth daily. 90 tablet 3  . budesonide-formoterol (SYMBICORT) 80-4.5 MCG/ACT inhaler Inhale 2 puffs into the lungs 2 (two) times daily.    . Choline Fenofibrate (FENOFIBRIC ACID) 135 MG CPDR Take 135 mg by mouth daily.  0  . cilostazol (PLETAL) 50 MG tablet Take 100 mg by mouth 2 (two) times daily.   0  . ezetimibe (ZETIA) 10 MG tablet Take 10 mg by mouth daily.    . fluticasone (FLONASE) 50 MCG/ACT nasal spray Place 2 sprays into both nostrils 2 (two) times daily.   0  . furosemide (LASIX) 40 MG tablet Take 1 tablet (40 mg total) by mouth daily. 90 tablet 3  . gabapentin (NEURONTIN) 300 MG capsule Take 300-900 mg by mouth See admin instructions. 300 mg at lunchtime then 600 mg at suppertime then 900 mg at bedtime  0  . HUMALOG MIX 75/25 (75-25) 100 UNIT/ML SUSP injection Inject 30 Units into the skin 2 (two) times daily as needed (CBG >124).   0  . oxyCODONE-acetaminophen (PERCOCET) 10-325 MG per  tablet Take 1 tablet by mouth 5 (five) times daily.   0  . pioglitazone-metformin (ACTOPLUS MET) 15-850 MG per tablet Take 1 tablet by mouth at bedtime.   0  . saxagliptin HCl (ONGLYZA) 5 MG TABS tablet Take 5 mg by mouth daily.    . simvastatin (ZOCOR) 10 MG tablet Take 10 mg by mouth at bedtime.   0  . tamsulosin (FLOMAX) 0.4 MG CAPS capsule Take 0.4 mg by mouth daily.   0  . tiZANidine (ZANAFLEX) 2 MG tablet Take 2 mg by mouth 2 (two) times daily.   0  . traMADol (ULTRAM) 50 MG tablet Take 1 tablet (50 mg total) by mouth daily. scheduled 30 tablet 0  . valsartan-hydrochlorothiazide (DIOVAN-HCT) 160-25 MG per tablet Take 1 tablet by mouth daily.   0   No current facility-administered medications for this visit.     Allergies:   Codeine    Social History:  The patient  reports that he has been smoking.  He has a 22.50 pack-year smoking history. His smokeless tobacco use includes Chew. He reports that he does not drink alcohol or use drugs.   Family History:  The patient's family history includes Bone cancer in his mother; Diabetes in his sister; Heart disease in his father.    ROS:  Please see the history of present illness.    Review of Systems: Constitutional:  denies fever, chills, diaphoresis, appetite change and fatigue.  HEENT: denies photophobia, eye pain, redness, hearing loss, ear pain, congestion, sore throat, rhinorrhea, sneezing, neck pain, neck stiffness and tinnitus.  Respiratory: denies SOB, DOE, cough, chest tightness, and wheezing.  Cardiovascular: denies chest pain, palpitations and leg swelling.  Gastrointestinal: denies nausea, vomiting, abdominal pain, diarrhea, constipation, blood in stool.  Genitourinary: denies dysuria, urgency, frequency, hematuria, flank pain and difficulty urinating.  Musculoskeletal: denies  myalgias, back pain, joint swelling, arthralgias and gait problem.   Skin: denies pallor, rash and wound.  Neurological: denies dizziness, seizures,  syncope, weakness, light-headedness, numbness and headaches.   Hematological: denies adenopathy, easy bruising, personal or family bleeding history.  Psychiatric/ Behavioral: denies suicidal ideation, mood changes, confusion, nervousness, sleep disturbance and agitation.       All other systems are reviewed and negative.    PHYSICAL EXAM: VS:  BP 122/68 (BP Location: Right Arm, Patient Position: Sitting, Cuff Size: Normal)   Pulse 96   Ht _0  (1.676 m)   Wt 214 lb 12.8 oz (97.4 kg)   SpO2 95%   BMI 34.67 kg/m  , BMI Body mass index is 34.67 kg/m. GEN: Well nourished, well developed, in no acute distress  HEENT: normal  Neck: no JVD, carotid  bruits, or masses Cardiac: RRR; no murmurs, rubs, or gallops, 1-2 + left leg edema   Respiratory:  clear to auscultation bilaterally, normal work of breathing GI: soft, nontender, nondistended, + BS MS: no deformity or atrophy  Skin: warm and dry, no rash Neuro:  Strength and sensation are intact Psych: normal   EKG:  EKG is not ordered today. The ekg ordered today demonstrates    Recent Labs: 09/23/2016: ALT 13; B Natriuretic Peptide 133.5 09/25/2016: Magnesium 1.9 10/06/2016: Hemoglobin 8.9 12/07/2016: BUN 25; Creatinine, Ser 1.14; Platelets 428; Potassium 3.9; Sodium 143    Lipid Panel    Component Value Date/Time   CHOL 120 09/24/2016 0750   TRIG 102 09/24/2016 0750   HDL 31 (L) 09/24/2016 0750   CHOLHDL 3.9 09/24/2016 0750   VLDL 20 09/24/2016 0750   LDLCALC 69 09/24/2016 0750      Wt Readings from Last 3 Encounters:  03/10/17 214 lb 12.8 oz (97.4 kg)  12/07/16 214 lb 12.8 oz (97.4 kg)  10/06/16 211 lb 12.8 oz (96.1 kg)      Other studies Reviewed: Additional studies/ records that were reviewed today include: . Review of the above records demonstrates:    ASSESSMENT AND PLAN:  1.  DVT - had a near life threatening GI bleed on Xarelto 15 BID .      He is now off the Xarelto due to this bleed. He was found  have 3 small AV malformations that were bleeding. These were cauterized.   Sees Dr. Katherine Roan .  He will need to follow up with his medical doctor.   2.  Non-ST segment elevation myocardial infarction:  Center had a non-ST segment elevation myocardial infarction in the setting of profound anemia and congestive heart failure. He did not have any chest pain the day following his procedure.   He has not been interested in having a cath   3.. GI bleed - he was found have AV malformations . 3 of these were cauterized during his last hospitalization.    4. COPD - says that he has stopped smoking at this point   5. Pulmonary HTN - his estimate PA pressures of 40. This is most likely due to his COPD  .  Labs/ tests ordered today include:  No orders of the defined types were placed in this encounter.    Disposition:   FU with [facility] PA  in 6 months      Gregory Moores, MD  03/10/2017 2:06 PM    [facility] [place], [place], [place]  [address] Phone: [phone]; Fax: [phone]

## 2017-03-15 ENCOUNTER — Encounter (HOSPITAL_COMMUNITY): Payer: Self-pay | Admitting: *Deleted

## 2017-03-15 ENCOUNTER — Emergency Department (HOSPITAL_COMMUNITY)
Admission: EM | Admit: 2017-03-15 | Discharge: 2017-03-15 | Disposition: A | Discharge status: Home / Self Care | Payer: Medicare Other | Attending: Emergency Medicine | Admitting: Emergency Medicine

## 2017-03-15 ENCOUNTER — Emergency Department (HOSPITAL_COMMUNITY): Payer: Medicare Other

## 2017-03-15 DIAGNOSIS — J181 Lobar pneumonia, unspecified organism: Secondary | ICD-10-CM | POA: Diagnosis not present

## 2017-03-15 DIAGNOSIS — E1140 Type 2 diabetes mellitus with diabetic neuropathy, unspecified: Secondary | ICD-10-CM | POA: Diagnosis not present

## 2017-03-15 DIAGNOSIS — F17200 Nicotine dependence, unspecified, uncomplicated: Secondary | ICD-10-CM | POA: Insufficient documentation

## 2017-03-15 DIAGNOSIS — I10 Essential (primary) hypertension: Secondary | ICD-10-CM | POA: Diagnosis not present

## 2017-03-15 DIAGNOSIS — R0602 Shortness of breath: Secondary | ICD-10-CM | POA: Diagnosis present

## 2017-03-15 DIAGNOSIS — Z8673 Personal history of transient ischemic attack (TIA), and cerebral infarction without residual deficits: Secondary | ICD-10-CM | POA: Insufficient documentation

## 2017-03-15 DIAGNOSIS — Z7982 Long term (current) use of aspirin: Secondary | ICD-10-CM | POA: Insufficient documentation

## 2017-03-15 DIAGNOSIS — J189 Pneumonia, unspecified organism: Secondary | ICD-10-CM

## 2017-03-15 DIAGNOSIS — Z7901 Long term (current) use of anticoagulants: Secondary | ICD-10-CM | POA: Insufficient documentation

## 2017-03-15 DIAGNOSIS — J449 Chronic obstructive pulmonary disease, unspecified: Secondary | ICD-10-CM | POA: Insufficient documentation

## 2017-03-15 DIAGNOSIS — Z794 Long term (current) use of insulin: Secondary | ICD-10-CM | POA: Insufficient documentation

## 2017-03-15 DIAGNOSIS — I252 Old myocardial infarction: Secondary | ICD-10-CM | POA: Diagnosis not present

## 2017-03-15 DIAGNOSIS — J849 Interstitial pulmonary disease, unspecified: Secondary | ICD-10-CM

## 2017-03-15 LAB — CBC
HCT: 32.1 % — ABNORMAL LOW (ref 39.0–52.0)
Hemoglobin: 10.4 g/dL — ABNORMAL LOW (ref 13.0–17.0)
MCH: 28 pg (ref 26.0–34.0)
MCHC: 32.4 g/dL (ref 30.0–36.0)
MCV: 86.3 fL (ref 78.0–100.0)
PLATELETS: 290 10*3/uL (ref 150–400)
RBC: 3.72 MIL/uL — AB (ref 4.22–5.81)
RDW: 18.3 % — AB (ref 11.5–15.5)
WBC: 10.7 10*3/uL — ABNORMAL HIGH (ref 4.0–10.5)

## 2017-03-15 LAB — I-STAT TROPONIN, ED: TROPONIN I, POC: 0 ng/mL (ref 0.00–0.08)

## 2017-03-15 LAB — BASIC METABOLIC PANEL
Anion gap: 10 (ref 5–15)
BUN: 31 mg/dL — ABNORMAL HIGH (ref 6–20)
CALCIUM: 8.9 mg/dL (ref 8.9–10.3)
CHLORIDE: 104 mmol/L (ref 101–111)
CO2: 24 mmol/L (ref 22–32)
CREATININE: 1.01 mg/dL (ref 0.61–1.24)
Glucose, Bld: 125 mg/dL — ABNORMAL HIGH (ref 65–99)
Potassium: 3.9 mmol/L (ref 3.5–5.1)
SODIUM: 138 mmol/L (ref 135–145)

## 2017-03-15 MED ORDER — DOXYCYCLINE HYCLATE 100 MG PO TABS
100.0000 mg | ORAL_TABLET | Freq: Once | ORAL | Status: AC
  Administered 2017-03-15: 100 mg via ORAL
  Filled 2017-03-15: qty 1

## 2017-03-15 MED ORDER — DOXYCYCLINE HYCLATE 100 MG PO CAPS: 100.0000 mg | ORAL_CAPSULE | Freq: Two times a day (BID) | ORAL | 0 refills | Status: AC

## 2017-03-15 NOTE — ED Provider Notes (Signed)
[facility] DEPT Provider Note   CSN: [id number] Arrival date & time: 03/15/17  1338     History   Chief Complaint Chief Complaint  Patient presents with  . Shortness of Breath    HPI Gregory Walker is a 71 y.o. male with history of COPD who presents with a 12 day history of productive cough, shortness of breath. Patient states he has had a productive cough with green sputum. He has shortness of breath on exertion. He denies any chest pain. Patient had one day a few days ago where was confused and tremulous. Patient had a zoster fax vaccine 12 days ago around the time patient had onset of symptoms. No fevers, chest pain, abdominal pain, nausea, vomiting, or urinary symptoms. Patient has taken over-the-counter cold medicine for his symptoms without significant relief.  HPI  Past Medical History:  Diagnosis Date  . Acute MI ([place])   . Anemia of chronic disease   . COPD (chronic obstructive pulmonary disease) ([place])   . Diabetes mellitus   . Diabetic neuropathy ([place])   . Diverticulosis   . DVT (deep venous thrombosis) ([facility])    on Xarelto  . Hyperlipidemia   . Hypertension   . Leukocytosis   . Stroke [facility])     Patient Active Problem List   Diagnosis Date Noted  . NSTEMI (non-ST elevated myocardial infarction) ([place]) 10/06/2016  . Diverticulosis of colon without hemorrhage   . Gastrointestinal hemorrhage 09/23/2016  . Cardiac ischemia   . Symptomatic anemia 09/15/2016  . History of CVA (cerebrovascular accident) 09/15/2016  . DVT (deep venous thrombosis) ([place]) 09/15/2016  . Cellulitis and abscess 12/28/2015  . Hyperlipidemia 12/28/2015  . Leukocytosis 12/28/2015  . Anemia of chronic disease 12/28/2015  . Abscess of right hand including fingers 12/28/2015  . CAP (community acquired pneumonia) 06/08/2015  . Diabetes ([place]) 06/08/2015  . HTN (hypertension) 06/08/2015  . Pneumonia 06/08/2015    Past Surgical History:  Procedure Laterality Date  . COLONOSCOPY WITH  PROPOFOL N/A 09/25/2016   Procedure: COLONOSCOPY WITH PROPOFOL;  Surgeon: Milus Banister, MD;  Location: [place];  Service: Endoscopy;  Laterality: N/A;  . ESOPHAGOGASTRODUODENOSCOPY N/A 09/24/2016   Procedure: ESOPHAGOGASTRODUODENOSCOPY (EGD);  Surgeon: Milus Banister, MD;  Location: [place];  Service: Endoscopy;  Laterality: N/A;  . EYE SURGERY         Home Medications    Prior to Admission medications   Medication Sig Start Date End Date Taking? Authorizing Provider  ALPRAZolam Duanne Moron) 0.5 MG tablet Take 0.5 mg by mouth 2 (two) times daily. 05/15/15   Historical Provider, MD  aspirin EC 81 MG tablet Take 1 tablet (81 mg total) by mouth daily. 12/08/16   Burtis Junes, NP  budesonide-formoterol (SYMBICORT) 80-4.5 MCG/ACT inhaler Inhale 2 puffs into the lungs 2 (two) times daily.    Historical Provider, MD  Choline Fenofibrate (FENOFIBRIC ACID) 135 MG CPDR Take 135 mg by mouth daily.  05/30/15   Historical Provider, MD  cilostazol (PLETAL) 50 MG tablet Take 100 mg by mouth 2 (two) times daily.  06/06/15   Historical Provider, MD  doxycycline (VIBRAMYCIN) 100 MG capsule Take 1 capsule (100 mg total) by mouth 2 (two) times daily. 03/15/17   Frederica Kuster, PA-C  ezetimibe (ZETIA) 10 MG tablet Take 10 mg by mouth daily.    Historical Provider, MD  fluticasone (FLONASE) 50 MCG/ACT nasal spray Place 2 sprays into both nostrils 2 (two) times daily.  05/30/15   Historical Provider, MD  furosemide (  LASIX) 40 MG tablet Take 1 tablet (40 mg total) by mouth daily. 12/08/16 03/10/17  Burtis Junes, NP  gabapentin (NEURONTIN) 300 MG capsule Take 300-900 mg by mouth See admin instructions. 300 mg at lunchtime then 600 mg at suppertime then 900 mg at bedtime 05/30/15   Historical Provider, MD  HUMALOG MIX 75/25 (75-25) 100 UNIT/ML SUSP injection Inject 30 Units into the skin 2 (two) times daily as needed (CBG >124).  03/25/15   Historical Provider, MD  oxyCODONE-acetaminophen (PERCOCET) 10-325 MG per  tablet Take 1 tablet by mouth 5 (five) times daily.  05/31/15   Historical Provider, MD  pioglitazone-metformin (ACTOPLUS MET) 15-850 MG per tablet Take 1 tablet by mouth at bedtime.  05/14/15   Historical Provider, MD  saxagliptin HCl (ONGLYZA) 5 MG TABS tablet Take 5 mg by mouth daily.    Historical Provider, MD  simvastatin (ZOCOR) 10 MG tablet Take 10 mg by mouth at bedtime.  05/27/15   Historical Provider, MD  SYMPROIC 0.2 MG TABS Take 0.2 mg by mouth. 03/02/17   Historical Provider, MD  tamsulosin (FLOMAX) 0.4 MG CAPS capsule Take 0.4 mg by mouth daily.  06/06/15   Historical Provider, MD  tiZANidine (ZANAFLEX) 2 MG tablet Take 2 mg by mouth 2 (two) times daily.  05/29/15   Historical Provider, MD  traMADol (ULTRAM) 50 MG tablet Take 1 tablet (50 mg total) by mouth daily. scheduled 12/08/16   Burtis Junes, NP  valsartan-hydrochlorothiazide (DIOVAN-HCT) 160-25 MG per tablet Take 1 tablet by mouth daily.  05/27/15   Historical Provider, MD  warfarin (COUMADIN) 5 MG tablet Take 5 mg by mouth.  02/16/17   Historical Provider, MD    Family History Family History  Problem Relation Age of Onset  . Bone cancer Mother   . Heart disease Father   . Diabetes Sister     Social History Social History  Substance Use Topics  . Smoking status: Current Some Day Smoker    Packs/day: 0.50    Years: 45.00  . Smokeless tobacco: Current User    Types: Chew     Comment: stopped smoking during hospital  . Alcohol use No     Allergies   Codeine   Review of Systems Review of Systems  Constitutional: Negative for chills and fever.  HENT: Negative for facial swelling and sore throat.   Respiratory: Positive for cough and shortness of breath.   Cardiovascular: Negative for chest pain.  Gastrointestinal: Negative for abdominal pain, nausea and vomiting.  Genitourinary: Negative for dysuria.  Musculoskeletal: Negative for back pain.  Skin: Negative for rash and wound.  Neurological: Positive for  tremors (none in a few days; resolved). Negative for headaches.  Psychiatric/Behavioral: The patient is not nervous/anxious.      Physical Exam Updated Vital Signs BP (!) 151/83   Pulse 69   Temp 97.5 F (36.4 C) (Oral)   Resp 19   Ht 5' 6"  (1.676 m)   Wt 95.3 kg   SpO2 96%   BMI 33.89 kg/m   Physical Exam  Constitutional: He appears well-developed and well-nourished. No distress.  HENT:  Head: Normocephalic and atraumatic.  Mouth/Throat: Oropharynx is clear and moist. No oropharyngeal exudate.  Eyes: Conjunctivae are normal. Pupils are equal, round, and reactive to light. Right eye exhibits no discharge. Left eye exhibits no discharge. No scleral icterus.  Neck: Normal range of motion. Neck supple. No thyromegaly present.  Cardiovascular: Normal rate, regular rhythm, normal heart sounds and intact distal  pulses.  Exam reveals no gallop and no friction rub.   No murmur heard. Pulmonary/Chest: Effort normal. No stridor. No respiratory distress. He has decreased breath sounds (R lung). He has no wheezes. He has rales (R mid lung and base).  Abdominal: Soft. Bowel sounds are normal. He exhibits no distension. There is no tenderness. There is no rebound and no guarding.  Musculoskeletal: He exhibits no edema.  Lymphadenopathy:    He has no cervical adenopathy.  Neurological: He is alert. Coordination normal.  Skin: Skin is warm and dry. No rash noted. He is not diaphoretic. No pallor.  Psychiatric: He has a normal mood and affect.  Nursing note and vitals reviewed.    ED Treatments / Results  Labs (all labs ordered are listed, but only abnormal results are displayed) Labs Reviewed  BASIC METABOLIC PANEL - Abnormal; Notable for the following:       Result Value   Glucose, Bld 125 (*)    BUN 31 (*)    All other components within normal limits  CBC - Abnormal; Notable for the following:    WBC 10.7 (*)    RBC 3.72 (*)    Hemoglobin 10.4 (*)    HCT 32.1 (*)    RDW 18.3  (*)    All other components within normal limits  I-STAT BETA HCG BLOOD, ED ([facility], [facility], [facility] ONLY) - Abnormal; Notable for the following:    I-stat hCG, quantitative 10.0 (*)    All other components within normal limits  I-STAT TROPOININ, ED    EKG  EKG Interpretation  Date/Time:  Monday March 15 2017 14:26:59 EDT Ventricular Rate:  79 PR Interval:  156 QRS Duration: 96 QT Interval:  402 QTC Calculation: 460 R Axis:   -26 Text Interpretation:  Normal sinus rhythm Minimal voltage criteria for LVH, may be normal variant Borderline ECG No significant change since last tracing Confirmed by [facility] MD, PEDRO ([id number]) on 03/15/2017 4:03:57 PM       Radiology Dg Chest 2 View  Result Date: 03/15/2017 CLINICAL DATA:  Shortness of breath, productive cough, congestion. EXAM: CHEST  2 VIEW COMPARISON:  11/03/2016 FINDINGS: Peribronchial thickening and interstitial prominence throughout the lungs. Concern for focal airspace opacity in the right mid and lower lung. No effusions. Heart is normal size. IMPRESSION: Bronchitic changes and interstitial prominence. This could reflect interstitial pneumonia. More focal area consolidation in the right mid and lower lung also concerning for pneumonia. Electronically Signed   By: Rolm Baptise M.D.   On: 03/15/2017 14:53    Procedures Procedures (including critical care time)  Medications Ordered in ED Medications  doxycycline (VIBRA-TABS) tablet 100 mg (100 mg Oral Given 03/15/17 1610)     Initial Impression / Assessment and Plan / ED Course  I have reviewed the triage vital signs and the nursing notes.  Pertinent labs & imaging results that were available during my care of the patient were reviewed by me and considered in my medical decision making (see chart for details).     CBC shows WBC 10.7. BMP shows BUN 31. Troponin negative. EKG shows NSR. CXR shows bronchitic changes and interstitial prominence, could reflect interstitial pneumonia; more focal  area consolidation in the right mid and lower lung also concerning for pneumonia. Patient with pneumonia with CURB 65 score of 2. Patient requests not to be admitted to the hospital, if possible. I feel with close follow-up with PCP, trial of outpatient antibiotics is reasonable. Will treat with doxycycline. Strict return  precautions given. Follow-up to PCP tomorrow. Patient vitals stable and discharged in satisfactory condition. Patient also evaluated by Dr. Leonette Monarch who guided the patient's management and agrees with plan.   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

## 2017-03-15 NOTE — Discharge Instructions (Signed)
Medications: Doxycycline  Treatment: Take doxycycline twice daily for 10 days. Continue taking your at home medications as prescribed.  Follow-up: Please follow-up with your primary care provider in 3-4 days for recheck of your symptoms, check of treatment efficacy, and to recheck your INR because doxycycline may interact with your Coumadin.

## 2017-03-15 NOTE — ED Triage Notes (Signed)
Pt in c/o generalized body aches, SOB, productive cough with green sputum, pt reports R arm pain & onset of symptoms post getting shingles vx x 12 days ago, pt reports trimmers intermittent since the vx, pt reports pain to the R arm at injection site, daughter states, " This past Saturday he was confused over half the day and shaking like he had Parkinson's disease. He was not confused Sunday though." pt A&O x4 in triage, ambulatory, no tremors noted

## 2017-03-15 NOTE — ED Notes (Signed)
Credit sent to main lab for I stat beta hCG test.

## 2017-03-17 LAB — I-STAT BETA HCG BLOOD, ED (MC, WL, AP ONLY)

## 2017-03-23 ENCOUNTER — Encounter (HOSPITAL_COMMUNITY): Payer: Self-pay

## 2017-03-23 ENCOUNTER — Emergency Department (HOSPITAL_COMMUNITY)
Admission: EM | Admit: 2017-03-23 | Discharge: 2017-03-23 | Disposition: A | Discharge status: Home / Self Care | Payer: Medicare Other | Attending: Emergency Medicine | Admitting: Emergency Medicine

## 2017-03-23 DIAGNOSIS — Z8673 Personal history of transient ischemic attack (TIA), and cerebral infarction without residual deficits: Secondary | ICD-10-CM | POA: Insufficient documentation

## 2017-03-23 DIAGNOSIS — E1140 Type 2 diabetes mellitus with diabetic neuropathy, unspecified: Secondary | ICD-10-CM | POA: Diagnosis not present

## 2017-03-23 DIAGNOSIS — I252 Old myocardial infarction: Secondary | ICD-10-CM | POA: Diagnosis not present

## 2017-03-23 DIAGNOSIS — M79604 Pain in right leg: Secondary | ICD-10-CM | POA: Insufficient documentation

## 2017-03-23 DIAGNOSIS — Z7982 Long term (current) use of aspirin: Secondary | ICD-10-CM | POA: Diagnosis not present

## 2017-03-23 DIAGNOSIS — J449 Chronic obstructive pulmonary disease, unspecified: Secondary | ICD-10-CM | POA: Diagnosis not present

## 2017-03-23 DIAGNOSIS — F17200 Nicotine dependence, unspecified, uncomplicated: Secondary | ICD-10-CM | POA: Diagnosis not present

## 2017-03-23 DIAGNOSIS — Z7901 Long term (current) use of anticoagulants: Secondary | ICD-10-CM | POA: Insufficient documentation

## 2017-03-23 LAB — I-STAT CHEM 8, ED
BUN: 40 mg/dL — AB (ref 6–20)
CALCIUM ION: 1.2 mmol/L (ref 1.15–1.40)
CHLORIDE: 99 mmol/L — AB (ref 101–111)
Creatinine, Ser: 1.5 mg/dL — ABNORMAL HIGH (ref 0.61–1.24)
GLUCOSE: 234 mg/dL — AB (ref 65–99)
HCT: 32 % — ABNORMAL LOW (ref 39.0–52.0)
Hemoglobin: 10.9 g/dL — ABNORMAL LOW (ref 13.0–17.0)
POTASSIUM: 4 mmol/L (ref 3.5–5.1)
Sodium: 138 mmol/L (ref 135–145)
TCO2: 26 mmol/L (ref 0–100)

## 2017-03-23 NOTE — ED Notes (Signed)
Papers reviewed with patient and he verbalizes understanding and leaving alone and ambulatory to day

## 2017-03-23 NOTE — ED Provider Notes (Signed)
[facility] DEPT Provider Note   CSN: [id number] Arrival date & time: 03/23/17  0857     History   Chief Complaint Chief Complaint  Patient presents with  . Leg Pain    HPI Gregory Walker is a 71 y.o. male.  Patient over the last week with intermittent right lower leg pain the goes from right ankle to right knee with walking. Pain gets better with rest. No joint swelling. No trauma. Hx of DM neuropathy.   The history is provided by the patient.  Leg Pain   This is a chronic problem. The current episode started more than 1 week ago. The problem occurs daily. The pain is present in the right lower leg. The quality of the pain is described as dull and aching. The pain is mild. Pertinent negatives include no numbness, full range of motion, no stiffness, no tingling and no itching. The symptoms are aggravated by activity. He has tried nothing for the symptoms. There has been no history of extremity trauma. Family history is significant for no rheumatoid arthritis and no gout.    Past Medical History:  Diagnosis Date  . Acute MI ([place])   . Anemia of chronic disease   . COPD (chronic obstructive pulmonary disease) ([place])   . Diabetes mellitus   . Diabetic neuropathy ([place])   . Diverticulosis   . DVT (deep venous thrombosis) ([facility])    on Xarelto  . Hyperlipidemia   . Hypertension   . Leukocytosis   . Stroke [facility])     Patient Active Problem List   Diagnosis Date Noted  . NSTEMI (non-ST elevated myocardial infarction) ([place]) 10/06/2016  . Diverticulosis of colon without hemorrhage   . Gastrointestinal hemorrhage 09/23/2016  . Cardiac ischemia   . Symptomatic anemia 09/15/2016  . History of CVA (cerebrovascular accident) 09/15/2016  . DVT (deep venous thrombosis) ([place]) 09/15/2016  . Cellulitis and abscess 12/28/2015  . Hyperlipidemia 12/28/2015  . Leukocytosis 12/28/2015  . Anemia of chronic disease 12/28/2015  . Abscess of right hand including fingers 12/28/2015  . CAP  (community acquired pneumonia) 06/08/2015  . Diabetes ([place]) 06/08/2015  . HTN (hypertension) 06/08/2015  . Pneumonia 06/08/2015    Past Surgical History:  Procedure Laterality Date  . COLONOSCOPY WITH PROPOFOL N/A 09/25/2016   Procedure: COLONOSCOPY WITH PROPOFOL;  Surgeon: Milus Banister, MD;  Location: [facility];  Service: Endoscopy;  Laterality: N/A;  . ESOPHAGOGASTRODUODENOSCOPY N/A 09/24/2016   Procedure: ESOPHAGOGASTRODUODENOSCOPY (EGD);  Surgeon: Milus Banister, MD;  Location: [facility];  Service: Endoscopy;  Laterality: N/A;  . EYE SURGERY         Home Medications    Prior to Admission medications   Medication Sig Start Date End Date Taking? Authorizing Provider  ALPRAZolam Duanne Moron) 0.5 MG tablet Take 0.5 mg by mouth 2 (two) times daily. 05/15/15   [provider]  aspirin EC 81 MG tablet Take 1 tablet (81 mg total) by mouth daily. 12/08/16   Burtis Junes, NP  budesonide-formoterol (SYMBICORT) 80-4.5 MCG/ACT inhaler Inhale 2 puffs into the lungs 2 (two) times daily.    [provider]  Choline Fenofibrate (FENOFIBRIC ACID) 135 MG CPDR Take 135 mg by mouth daily.  05/30/15   [provider]  cilostazol (PLETAL) 50 MG tablet Take 100 mg by mouth 2 (two) times daily.  06/06/15   [provider]  doxycycline (VIBRAMYCIN) 100 MG capsule Take 1 capsule (100 mg total) by mouth 2 (two) times daily. 03/15/17   Eliezer Mccoy  M, PA-C  ezetimibe (ZETIA) 10 MG tablet Take 10 mg by mouth daily.    [provider]  fluticasone (FLONASE) 50 MCG/ACT nasal spray Place 2 sprays into both nostrils 2 (two) times daily.  05/30/15   [provider]  furosemide (LASIX) 40 MG tablet Take 1 tablet (40 mg total) by mouth daily. 12/08/16 03/10/17  Burtis Junes, NP  gabapentin (NEURONTIN) 300 MG capsule Take 300-900 mg by mouth See admin instructions. 300 mg at lunchtime then 600 mg at suppertime then 900 mg at bedtime 05/30/15   [provider]  HUMALOG MIX 75/25 (75-25) 100 UNIT/ML SUSP injection Inject 30 Units into the skin 2 (two) times daily as needed (CBG >124).  03/25/15   [provider]  oxyCODONE-acetaminophen (PERCOCET) 10-325 MG per tablet Take 1 tablet by mouth 5 (five) times daily.  05/31/15   [provider]  pioglitazone-metformin (ACTOPLUS MET) 15-850 MG per tablet Take 1 tablet by mouth at bedtime.  05/14/15   [provider]  saxagliptin HCl (ONGLYZA) 5 MG TABS tablet Take 5 mg by mouth daily.    [provider]  simvastatin (ZOCOR) 10 MG tablet Take 10 mg by mouth at bedtime.  05/27/15   [provider]  SYMPROIC 0.2 MG TABS Take 0.2 mg by mouth. 03/02/17   [provider]  tamsulosin (FLOMAX) 0.4 MG CAPS capsule Take 0.4 mg by mouth daily.  06/06/15   [provider]  tiZANidine (ZANAFLEX) 2 MG tablet Take 2 mg by mouth 2 (two) times daily.  05/29/15   [provider]  traMADol (ULTRAM) 50 MG tablet Take 1 tablet (50 mg total) by mouth daily. scheduled 12/08/16   Burtis Junes, NP  valsartan-hydrochlorothiazide (DIOVAN-HCT) 160-25 MG per tablet Take 1 tablet by mouth daily.  05/27/15   [provider]  warfarin (COUMADIN) 5 MG tablet Take 5 mg by mouth.  02/16/17   [provider]    Family History Family History  Problem Relation Age of Onset  . Bone cancer Mother   . Heart disease Father   . Diabetes Sister     Social History Social History  Substance Use Topics  . Smoking status: Current Some Day Smoker    Packs/day: 0.50    Years: 45.00  . Smokeless tobacco: Current User    Types: Chew     Comment: stopped smoking during hospital  . Alcohol use No     Allergies   Codeine   Review of Systems Review of Systems  Constitutional: Negative for chills and fever.  HENT: Negative for ear pain and sore throat.   Eyes: Negative for pain and visual disturbance.  Respiratory: Negative for cough and  shortness of breath.   Cardiovascular: Negative for chest pain and palpitations.  Gastrointestinal: Negative for abdominal pain and vomiting.  Genitourinary: Negative for dysuria and hematuria.  Musculoskeletal: Positive for gait problem. Negative for arthralgias, back pain, joint swelling and stiffness.  Skin: Negative for color change, itching and rash.  Neurological: Negative for tingling, seizures, syncope and numbness.  All other systems reviewed and are negative.    Physical Exam Updated Vital Signs BP (!) 106/52   Pulse 80   Temp 98.1 F (36.7 C) (Oral)   Resp 16   SpO2 97%   Physical Exam  Constitutional: He appears well-developed and well-nourished.  HENT:  Head: Normocephalic and atraumatic.  Eyes: Conjunctivae are normal. Pupils are equal, round, and reactive to light.  Neck: Neck  supple.  Cardiovascular: Normal rate, regular rhythm, normal heart sounds and intact distal pulses.   No murmur heard. Pulmonary/Chest: Effort normal and breath sounds normal. No respiratory distress.  Abdominal: Soft. There is no tenderness.  Musculoskeletal: Normal range of motion. He exhibits no edema, tenderness or deformity.  Neurological: He is alert.  Skin: Skin is warm and dry. Capillary refill takes less than 2 seconds.  Psychiatric: He has a normal mood and affect.  Nursing note and vitals reviewed.    ED Treatments / Results  Labs (all labs ordered are listed, but only abnormal results are displayed) Labs Reviewed  I-STAT CHEM 8, ED - Abnormal; Notable for the following:       Result Value   Chloride 99 (*)    BUN 40 (*)    Creatinine, Ser 1.50 (*)    Glucose, Bld 234 (*)    Hemoglobin 10.9 (*)    HCT 32.0 (*)    All other components within normal limits    EKG  EKG Interpretation None       Radiology No results found.  Procedures Procedures (including critical care time)  Medications Ordered in ED Medications - No data to display   Initial  Impression / Assessment and Plan / ED Course  I have reviewed the triage vital signs and the nursing notes.  Pertinent labs & imaging results that were available during my care of the patient were reviewed by me and considered in my medical decision making (see chart for details).     Josep Luviano is a 71 year old male with history of COPD, anemia, diabetes, diabetic neuropathy, DVT, high cholesterol, hypertension who presents to the ED with right leg pain.  Patient's vitals at time of arrival to the ED are within normal limits and patient is without fever.  Patient states that he has had pain for about a week.  He denies any trauma.  Patient states that pain is worse when he walks and gets better with rest.  Patient recently diagnosed with a clot in his left leg and has been ambulating with a cane and states that he believes that he has been putting more weight on his right leg and pain could be from overuse.  Patient does have a history of diabetic neuropathy in his legs too.  Patient denies any fever, chills.  On exam, patient has overall unremarkable exam.  There is no joint effusion, no calf tenderness, no leg swelling,and pt with strong pulses in lower extremities bilaterally.  There is no obvious deformity.  Patient likely with diabetic neuropathy versus possible claudication from atherosclerotic disease given that pain is worse when he walks and better when he rests. Possible pain from overuse.  To evaluate will get i-STAT Chem-8 to look for electrolyte abnormalities.  Patient with hemoglobin of 10.9 which is at baseline, electrolytes unremarkable and creatinine at baseline.  Patient told to increase oral hydratio and close follow-up with primary care provider to further discuss leg pain.  Told to return to ED if symptoms worsen.  No signs to suggest septic joint, DVT, infection.  I discussed this patient w/ Dr. Jeanell Sparrow who supervised the care of this patient.  Final Clinical Impressions(s) / ED  Diagnoses   Final diagnoses:  Right leg pain    New Prescriptions Discharge Medication List as of 03/23/2017 10:40 AM       Lennice Sites, DO 03/23/17 2034    Pattricia Boss, MD 04/03/17 1147

## 2017-03-23 NOTE — ED Notes (Signed)
Patient insisted on ambulating back tot he room with his cane Able to do it with a steady gait

## 2017-03-23 NOTE — Discharge Instructions (Signed)
Please increase oral hydration over the next several days and follow up with PCP to re-check your kidney function.

## 2017-03-23 NOTE — ED Triage Notes (Signed)
Pt states he has right leg pain radiating from his right knee to his ankle. He has hx of DVT. Currently taking blood thinners. Denied injury.

## 2017-03-26 ENCOUNTER — Encounter (HOSPITAL_COMMUNITY): Payer: Self-pay

## 2017-03-26 ENCOUNTER — Emergency Department (HOSPITAL_COMMUNITY): Payer: Medicare Other

## 2017-03-26 ENCOUNTER — Emergency Department (HOSPITAL_COMMUNITY)
Admission: EM | Admit: 2017-03-26 | Discharge: 2017-03-26 | Disposition: A | Discharge status: Home / Self Care | Payer: Medicare Other | Attending: Emergency Medicine | Admitting: Emergency Medicine

## 2017-03-26 DIAGNOSIS — Z8673 Personal history of transient ischemic attack (TIA), and cerebral infarction without residual deficits: Secondary | ICD-10-CM | POA: Insufficient documentation

## 2017-03-26 DIAGNOSIS — I10 Essential (primary) hypertension: Secondary | ICD-10-CM | POA: Diagnosis not present

## 2017-03-26 DIAGNOSIS — M79661 Pain in right lower leg: Secondary | ICD-10-CM | POA: Diagnosis present

## 2017-03-26 DIAGNOSIS — M1711 Unilateral primary osteoarthritis, right knee: Secondary | ICD-10-CM | POA: Diagnosis not present

## 2017-03-26 DIAGNOSIS — Z7982 Long term (current) use of aspirin: Secondary | ICD-10-CM | POA: Insufficient documentation

## 2017-03-26 DIAGNOSIS — Z7901 Long term (current) use of anticoagulants: Secondary | ICD-10-CM | POA: Diagnosis not present

## 2017-03-26 DIAGNOSIS — J449 Chronic obstructive pulmonary disease, unspecified: Secondary | ICD-10-CM | POA: Insufficient documentation

## 2017-03-26 DIAGNOSIS — I252 Old myocardial infarction: Secondary | ICD-10-CM | POA: Diagnosis not present

## 2017-03-26 DIAGNOSIS — E1140 Type 2 diabetes mellitus with diabetic neuropathy, unspecified: Secondary | ICD-10-CM | POA: Insufficient documentation

## 2017-03-26 DIAGNOSIS — F17200 Nicotine dependence, unspecified, uncomplicated: Secondary | ICD-10-CM | POA: Insufficient documentation

## 2017-03-26 DIAGNOSIS — Z79899 Other long term (current) drug therapy: Secondary | ICD-10-CM | POA: Insufficient documentation

## 2017-03-26 DIAGNOSIS — Z794 Long term (current) use of insulin: Secondary | ICD-10-CM | POA: Insufficient documentation

## 2017-03-26 LAB — BASIC METABOLIC PANEL
Anion gap: 9 (ref 5–15)
BUN: 31 mg/dL — AB (ref 6–20)
CHLORIDE: 102 mmol/L (ref 101–111)
CO2: 26 mmol/L (ref 22–32)
Calcium: 9.5 mg/dL (ref 8.9–10.3)
Creatinine, Ser: 1.2 mg/dL (ref 0.61–1.24)
GFR calc Af Amer: >60 mL/min (ref >60)
GFR calc non Af Amer: 60 mL/min — ABNORMAL LOW (ref >60)
GLUCOSE: 145 mg/dL — AB (ref 65–99)
POTASSIUM: 3.7 mmol/L (ref 3.5–5.1)
Sodium: 137 mmol/L (ref 135–145)

## 2017-03-26 LAB — PROTIME-INR
INR: 2.04
Prothrombin Time: 23.4 seconds — ABNORMAL HIGH (ref 11.4–15.2)

## 2017-03-26 LAB — CBC
HCT: 34.3 % — ABNORMAL LOW (ref 39.0–52.0)
Hemoglobin: 10.5 g/dL — ABNORMAL LOW (ref 13.0–17.0)
MCH: 27.3 pg (ref 26.0–34.0)
MCHC: 30.6 g/dL (ref 30.0–36.0)
MCV: 89.1 fL (ref 78.0–100.0)
PLATELETS: 445 10*3/uL — AB (ref 150–400)
RBC: 3.85 MIL/uL — AB (ref 4.22–5.81)
RDW: 17.3 % — ABNORMAL HIGH (ref 11.5–15.5)
WBC: 6.1 10*3/uL (ref 4.0–10.5)

## 2017-03-26 MED ORDER — TRAMADOL HCL 50 MG PO TABS: 50.0000 mg | ORAL_TABLET | Freq: Every day | ORAL | 0 refills | Status: AC

## 2017-03-26 NOTE — ED Notes (Signed)
Pt verbalized understanding of d/c instructions and has no further questions. Pt to follow up with pcp and ortho.

## 2017-03-26 NOTE — ED Triage Notes (Signed)
Pt states he has pain in his right leg, denies any injury. Mar[place] Kitchen. He denies any recent travel. He has hx of DVT and currently takes coumadin for that. Pt ambulatory with a cane.

## 2017-03-26 NOTE — ED Provider Notes (Signed)
[facility] DEPT Provider Note   CSN: [id number] Arrival date & time: 03/26/17  1220     History   Chief Complaint Chief Complaint  Patient presents with  . Leg Pain    HPI Gregory Walker is a 72 y.o. male.  HPI Patient presents to the emergency room for evaluation of right knee pain. Patient has a history of DVT causing discomfort in his left leg. Patient is currently taking anticoagulants. Patient states he started having some pain in his right knee. He denies any injury. He has not had any fevers or chills.  Past Medical History:  Diagnosis Date  . Acute MI ([place])   . Anemia of chronic disease   . COPD (chronic obstructive pulmonary disease) ([place])   . Diabetes mellitus   . Diabetic neuropathy ([place])   . Diverticulosis   . DVT (deep venous thrombosis) ([facility])    on Xarelto  . Hyperlipidemia   . Hypertension   . Leukocytosis   . Stroke [facility])     Patient Active Problem List   Diagnosis Date Noted  . NSTEMI (non-ST elevated myocardial infarction) ([place]) 10/06/2016  . Diverticulosis of colon without hemorrhage   . Gastrointestinal hemorrhage 09/23/2016  . Cardiac ischemia   . Symptomatic anemia 09/15/2016  . History of CVA (cerebrovascular accident) 09/15/2016  . DVT (deep venous thrombosis) ([place]) 09/15/2016  . Cellulitis and abscess 12/28/2015  . Hyperlipidemia 12/28/2015  . Leukocytosis 12/28/2015  . Anemia of chronic disease 12/28/2015  . Abscess of right hand including fingers 12/28/2015  . CAP (community acquired pneumonia) 06/08/2015  . Diabetes ([place]) 06/08/2015  . HTN (hypertension) 06/08/2015  . Pneumonia 06/08/2015    Past Surgical History:  Procedure Laterality Date  . COLONOSCOPY WITH PROPOFOL N/A 09/25/2016   Procedure: COLONOSCOPY WITH PROPOFOL;  Surgeon: Milus Banister, MD;  Location: [facility];  Service: Endoscopy;  Laterality: N/A;  . ESOPHAGOGASTRODUODENOSCOPY N/A 09/24/2016   Procedure: ESOPHAGOGASTRODUODENOSCOPY (EGD);  Surgeon: Milus Banister, MD;  Location: [place];  Service: Endoscopy;  Laterality: N/A;  . EYE SURGERY         Home Medications    Prior to Admission medications   Medication Sig Start Date End Date Taking? Authorizing Provider  ALPRAZolam Duanne Moron) 0.5 MG tablet Take 0.5 mg by mouth 2 (two) times daily. 05/15/15   [provider]  aspirin EC 81 MG tablet Take 1 tablet (81 mg total) by mouth daily. 12/08/16   Burtis Junes, NP  budesonide-formoterol (SYMBICORT) 80-4.5 MCG/ACT inhaler Inhale 2 puffs into the lungs 2 (two) times daily.    [provider]  Choline Fenofibrate (FENOFIBRIC ACID) 135 MG CPDR Take 135 mg by mouth daily.  05/30/15   [provider]  cilostazol (PLETAL) 50 MG tablet Take 100 mg by mouth 2 (two) times daily.  06/06/15   [provider]  doxycycline (VIBRAMYCIN) 100 MG capsule Take 1 capsule (100 mg total) by mouth 2 (two) times daily. 03/15/17   Law, Bea Graff, PA-C  ezetimibe (ZETIA) 10 MG tablet Take 10 mg by mouth daily.    [provider]  fluticasone (FLONASE) 50 MCG/ACT nasal spray Place 2 sprays into both nostrils 2 (two) times daily.  05/30/15   [provider]  furosemide (LASIX) 40 MG tablet Take 1 tablet (40 mg total) by mouth daily. 12/08/16 03/10/17  Burtis Junes, NP  gabapentin (NEURONTIN) 300 MG capsule Take 300-900 mg by mouth See admin instructions. 300 mg at lunchtime then 600 mg at  suppertime then 900 mg at bedtime 05/30/15   [provider]  HUMALOG MIX 75/25 (75-25) 100 UNIT/ML SUSP injection Inject 30 Units into the skin 2 (two) times daily as needed (CBG >124).  03/25/15   [provider]  oxyCODONE-acetaminophen (PERCOCET) 10-325 MG per tablet Take 1 tablet by mouth 5 (five) times daily.  05/31/15   [provider]  pioglitazone-metformin (ACTOPLUS MET) 15-850 MG per tablet Take 1 tablet by mouth at bedtime.  05/14/15   [provider]  saxagliptin HCl (ONGLYZA) 5 MG TABS  tablet Take 5 mg by mouth daily.    [provider]  simvastatin (ZOCOR) 10 MG tablet Take 10 mg by mouth at bedtime.  05/27/15   [provider]  SYMPROIC 0.2 MG TABS Take 0.2 mg by mouth. 03/02/17   [provider]  tamsulosin (FLOMAX) 0.4 MG CAPS capsule Take 0.4 mg by mouth daily.  06/06/15   [provider]  tiZANidine (ZANAFLEX) 2 MG tablet Take 2 mg by mouth 2 (two) times daily.  05/29/15   [provider]  traMADol (ULTRAM) 50 MG tablet Take 1 tablet (50 mg total) by mouth daily. scheduled 03/26/17   Dorie Rank, MD  valsartan-hydrochlorothiazide (DIOVAN-HCT) 160-25 MG per tablet Take 1 tablet by mouth daily.  05/27/15   [provider]  warfarin (COUMADIN) 5 MG tablet Take 5 mg by mouth.  02/16/17   [provider]    Family History Family History  Problem Relation Age of Onset  . Bone cancer Mother   . Heart disease Father   . Diabetes Sister     Social History Social History  Substance Use Topics  . Smoking status: Current Some Day Smoker    Packs/day: 0.50    Years: 45.00  . Smokeless tobacco: Current User    Types: Chew     Comment: stopped smoking during hospital  . Alcohol use No     Allergies   Codeine   Review of Systems Review of Systems  All other systems reviewed and are negative.    Physical Exam Updated Vital Signs BP (!) 108/56   Pulse 80   Temp 97.7 F (36.5 C) (Oral)   Resp 16   SpO2 96%   Physical Exam  Constitutional: He appears well-developed and well-nourished. No distress.  HENT:  Head: Normocephalic and atraumatic.  Right Ear: External ear normal.  Left Ear: External ear normal.  Eyes: Conjunctivae are normal. Right eye exhibits no discharge. Left eye exhibits no discharge. No scleral icterus.  Neck: Neck supple. No tracheal deviation present.  Cardiovascular: Normal rate, regular rhythm and intact distal pulses.   Pulmonary/Chest: Effort normal and breath sounds normal. No  stridor. No respiratory distress. He has no wheezes. He has no rales.  Abdominal: Soft. Bowel sounds are normal. He exhibits no distension. There is no tenderness. There is no rebound and no guarding.  Musculoskeletal: He exhibits tenderness. He exhibits no edema.       Right shoulder: He exhibits no swelling.       Right knee: He exhibits no swelling. Tenderness found.  Neurological: He is alert. He has normal strength. No cranial nerve deficit (no facial droop, extraocular movements intact, no slurred speech) or sensory deficit. He exhibits normal muscle tone. He displays no seizure activity. Coordination normal.  Skin: Skin is warm. No rash noted. He is not diaphoretic.  Psychiatric: He has a normal mood and affect.  Nursing note and vitals reviewed.  ED Treatments / Results  Labs (all labs ordered are listed, but only abnormal results are displayed) Labs Reviewed  CBC - Abnormal; Notable for the following:       Result Value   RBC 3.85 (*)    Hemoglobin 10.5 (*)    HCT 34.3 (*)    RDW 17.3 (*)    Platelets 445 (*)    All other components within normal limits  BASIC METABOLIC PANEL - Abnormal; Notable for the following:    Glucose, Bld 145 (*)    BUN 31 (*)    GFR calc non Af Amer 60 (*)    All other components within normal limits  PROTIME-INR - Abnormal; Notable for the following:    Prothrombin Time 23.4 (*)    All other components within normal limits    Radiology Dg Tibia/fibula Right  Result Date: 03/26/2017 CLINICAL DATA:  Generalized right knee pain for the past 2 weeks. No known injury. EXAM: RIGHT TIBIA AND FIBULA - 2 VIEW COMPARISON:  None. FINDINGS: Extensive right knee degenerative changes. These are described separately in the right knee radiographs report. Otherwise, unremarkable bones and soft tissues. IMPRESSION: Extensive right knee degenerative changes.  No acute abnormality. Electronically Signed   By: Claudie Revering M.D.   On: 03/26/2017 15:26   Dg Knee  Complete 4 Views Right  Result Date: 03/26/2017 CLINICAL DATA:  Generalized RIGHT knee pain for 2 weeks, history of blood clots in RIGHT leg for months ago, history diabetes mellitus, hypertension, coronary disease post MI, COPD EXAM: RIGHT KNEE - COMPLETE 4+ VIEW COMPARISON:  None ; prior exam on the time line from 07/05/2011 does not load for comparison. FINDINGS: Osseous demineralization. Advanced tricompartmental osteoarthritic changes with joint space narrowing and spur formation. No acute fracture, dislocation, or bone destruction. No knee joint effusion. IMPRESSION: Advanced tricompartmental osteoarthritic changes RIGHT knee. No acute bony abnormalities. Electronically Signed   By: Lavonia Dana M.D.   On: 03/26/2017 15:27    Procedures Procedures (including critical care time)  Medications Ordered in ED Medications - No data to display   Initial Impression / Assessment and Plan / ED Course  I have reviewed the triage vital signs and the nursing notes.  Pertinent labs & imaging results that were available during my care of the patient were reviewed by me and considered in my medical decision making (see chart for details).   patient has normal sensation on exam. He has no signs of infection. He has normal pulses peripherally. patient's x-rays show advanced osteoarthritis of the right knee. I suspect the source of the patient's knee pain. We discussed outpatient follow-up with orthopedics.  Final Clinical Impressions(s) / ED Diagnoses   Final diagnoses:  Osteoarthritis of right knee, unspecified osteoarthritis type    New Prescriptions Current Discharge Medication List       Dorie Rank, MD 03/26/17 [phone]

## 2017-03-26 NOTE — ED Notes (Signed)
Pt taken to xray 

## 2017-03-26 NOTE — ED Notes (Signed)
Recent hx of DVT 'behind left knee'. Pt states his pcp advised him to stay on Coumadin. Denies injury to legs.

## 2017-03-30 ENCOUNTER — Ambulatory Visit (INDEPENDENT_AMBULATORY_CARE_PROVIDER_SITE_OTHER): Payer: Medicare Other | Admitting: Orthopaedic Surgery

## 2017-03-30 ENCOUNTER — Encounter (INDEPENDENT_AMBULATORY_CARE_PROVIDER_SITE_OTHER): Payer: Self-pay | Admitting: Orthopaedic Surgery

## 2017-03-30 DIAGNOSIS — M1711 Unilateral primary osteoarthritis, right knee: Secondary | ICD-10-CM

## 2017-03-30 NOTE — Progress Notes (Addendum)
Office Visit Note   Patient: Gregory Walker           Date of Birth: 07-20-1946           MRN: [id number] Visit Date: 03/30/2017              Requested by: Corine Shelter, MD [address] [place], [place] [address] PCP: Corine Shelter, MD   Assessment & Plan: Visit Diagnoses:  1. Unilateral primary osteoarthritis, right knee     Plan: Patient has severe degenerative joint disease with medial tibial plateau wear.  We discussed knee replacement surgery for quality of life. Patient is not ready for this just yet. I gave him information on knee replacement surgery. I'll see him back as needed.  Follow-Up Instructions: Return if symptoms worsen or fail to improve.   Orders:  No orders of the defined types were placed in this encounter.  No orders of the defined types were placed in this encounter.     Procedures: No procedures performed   Clinical Data: No additional findings.   Subjective: Chief Complaint  Patient presents with  . Right Knee - Pain  . Left Knee - Pain    Patient is a 71 year old gentleman comes in with severe right knee pain for years that's been getting worse. He ambulate with a cane. He is fail conservative treatment. He is currently on Coumadin for DVT. His knees feel like they want to give out. He denies any radiation of pain.    Review of Systems  Constitutional: Negative.   All other systems reviewed and are negative.    Objective: Vital Signs: There were no vitals taken for this visit.  Physical Exam  Constitutional: He is oriented to person, place, and time. He appears well-developed and well-nourished.  HENT:  Head: Normocephalic and atraumatic.  Eyes: Pupils are equal, round, and reactive to light.  Neck: Neck supple.  Pulmonary/Chest: Effort normal.  Abdominal: Soft.  Musculoskeletal: Normal range of motion.  Neurological: He is alert and oriented to person, place, and time.  Skin: Skin is warm.  Psychiatric:  He has a normal mood and affect. His behavior is normal. Judgment and thought content normal.  Nursing note and vitals reviewed.   Ortho Exam Right knee exam shows no joint effusion. He does have a varus alignment. Range of motion is normal. Severe crepitus. Specialty Comments:  No specialty comments available.  Imaging: No results found.   PMFS History: Patient Active Problem List   Diagnosis Date Noted  . NSTEMI (non-ST elevated myocardial infarction) (HCC) 10/06/2016  . Diverticulosis of colon without hemorrhage   . Gastrointestinal hemorrhage 09/23/2016  . Cardiac ischemia   . Symptomatic anemia 09/15/2016  . History of CVA (cerebrovascular accident) 09/15/2016  . DVT (deep venous thrombosis) (HCC) 09/15/2016  . Cellulitis and abscess 12/28/2015  . Hyperlipidemia 12/28/2015  . Leukocytosis 12/28/2015  . Anemia of chronic disease 12/28/2015  . Abscess of right hand including fingers 12/28/2015  . CAP (community acquired pneumonia) 06/08/2015  . Diabetes (HCC) 06/08/2015  . HTN (hypertension) 06/08/2015  . Pneumonia 06/08/2015   Past Medical History:  Diagnosis Date  . Acute MI (HCC)   . Anemia of chronic disease   . COPD (chronic obstructive pulmonary disease) (HCC)   . Diabetes mellitus   . Diabetic neuropathy (HCC)   . Diverticulosis   . DVT (deep venous thrombosis) (HCC)    on Xarelto  . Hyperlipidemia   . Hypertension   . Leukocytosis   .  Stroke [facility](HCC)     Family History  Problem Relation Age of Onset  . Bone cancer Mother   . Heart disease Father   . Diabetes Sister     Past Surgical History:  Procedure Laterality Date  . COLONOSCOPY WITH PROPOFOL N/A 09/25/2016   Procedure: COLONOSCOPY WITH PROPOFOL;  Surgeon: Rachael Feeaniel P Jacobs, MD;  [facility];  Service: Endoscopy;  Laterality: N/A;  . ESOPHAGOGASTRODUODENOSCOPY N/A 09/24/2016   Procedure: ESOPHAGOGASTRODUODENOSCOPY (EGD);  Surgeon: Rachael Feeaniel P Jacobs, MD;  [facility];  Service:  Endoscopy;  Laterality: N/A;  . EYE SURGERY     Social History   Occupational History  . retired    Social History Main Topics  . Smoking status: Current Some Day Smoker    Packs/day: 0.50    Years: 45.00  . Smokeless tobacco: Current User    Types: Chew     Comment: stopped smoking during hospital  . Alcohol use No  . Drug use: No  . Sexual activity: Not on file

## 2017-08-13 ENCOUNTER — Ambulatory Visit (INDEPENDENT_AMBULATORY_CARE_PROVIDER_SITE_OTHER): Payer: Medicare Other | Admitting: Podiatry

## 2017-08-13 ENCOUNTER — Encounter: Payer: Self-pay | Admitting: Podiatry

## 2017-08-13 DIAGNOSIS — D689 Coagulation defect, unspecified: Secondary | ICD-10-CM

## 2017-08-13 DIAGNOSIS — M79675 Pain in left toe(s): Secondary | ICD-10-CM | POA: Diagnosis not present

## 2017-08-13 DIAGNOSIS — B351 Tinea unguium: Secondary | ICD-10-CM | POA: Diagnosis not present

## 2017-08-13 DIAGNOSIS — M79674 Pain in right toe(s): Secondary | ICD-10-CM

## 2017-08-13 DIAGNOSIS — M129 Arthropathy, unspecified: Secondary | ICD-10-CM

## 2017-08-13 NOTE — Progress Notes (Signed)
Complaint:  Visit Type: Patient returns to my office for continued preventative foot care services. Complaint: Patient states" my nails have grown long and thick and become painful to walk and wear shoes" Patient has been diagnosed with DM with neuropathy.  This patient is also taking medicine causing coagulation defect The patient presents for preventative foot care services. No changes to ROS.  He also states that he is having and numbness extending from his mid foot, left into his toes.  This patient has a history of DVTs left leg.  Podiatric Exam: Vascular: dorsalis pedis and posterior tibial pulses are minimally  palpable bilateral. Capillary return is immediate. Temperature gradient is WNL. Skin turgor WNL  Sensorium: Diminished  Semmes Weinstein monofilament test. Normal tactile sensation bilaterally. Nail Exam: Pt has thick disfigured discolored nails with subungual debris noted bilateral entire nail hallux through fifth toenails Ulcer Exam: There is no evidence of ulcer or pre-ulcerative changes or infection. Orthopedic Exam: Muscle tone and strength are WNL. No limitations in general ROM. No crepitus or effusions noted. Foot type and digits show no abnormalities. Bony prominences are unremarkable. Skin: No Porokeratosis. No infection or ulcers  Diagnosis:  Onychomycosis, , Pain in right toe, pain in left toes  Treatment & Plan Procedures and Treatment: Consent by patient was obtained for treatment procedures.   Debridement of mycotic and hypertrophic toenails, 1 through 5 bilateral and clearing of subungual debris. No ulceration, no infection noted. Told the patient that power step insoles might help the arthritis pain. By supporting his foot.  He will consider these insoles in the future. Return Visit-Office Procedure: Patient instructed to return to the office for a follow up visit 3 months for continued evaluation and treatment.    Helane Gunther DPM

## 2017-08-23 IMAGING — CR DG CHEST 1V PORT
1 series · 1 of 1 positions shown · non-contrast
Comparison: Chest x-ray 07/11/2015.

CLINICAL DATA: 70-year-old male with chest pain and shortness of
breath today.

EXAM:
PORTABLE CHEST 1 VIEW

[AP]
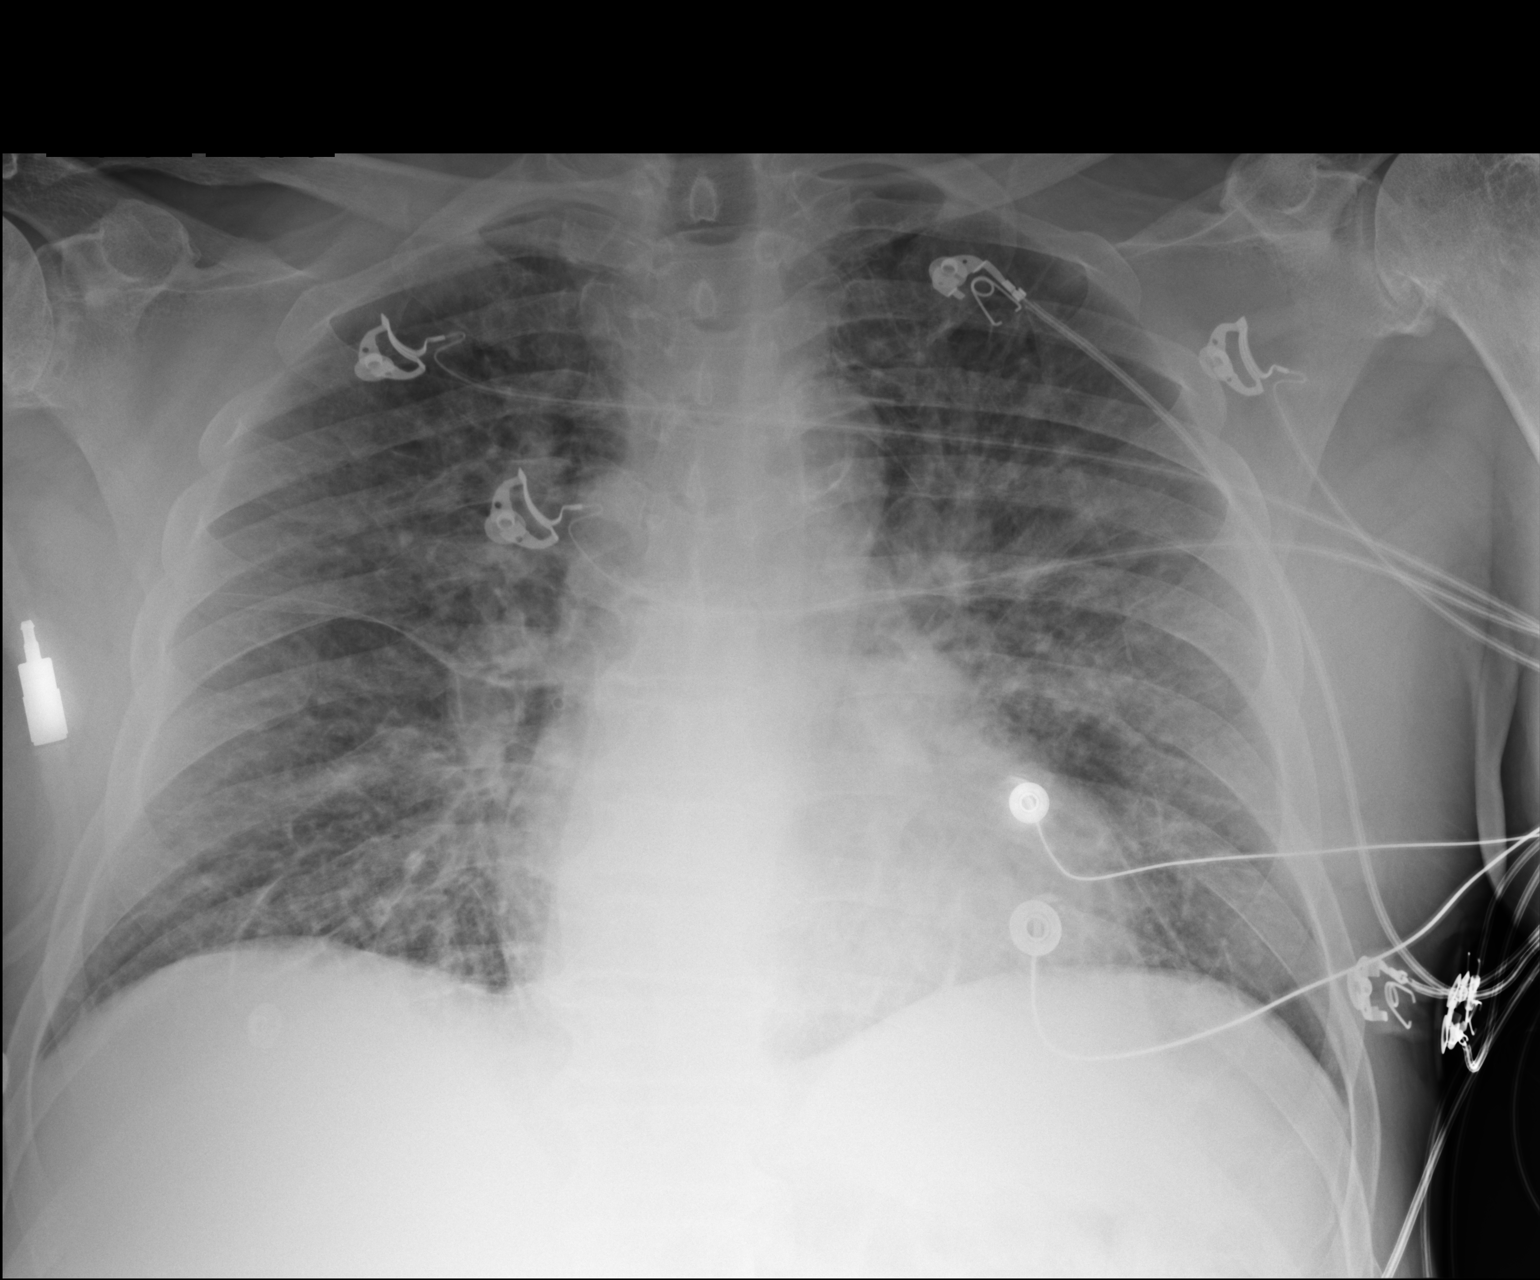

[1 of 1 positions shown; findings below may reference images not displayed]

FINDINGS: There is cephalization of the pulmonary vasculature and slight
indistinctness of the interstitial markings suggestive of mild
pulmonary edema. Lung volumes are low. No pleural effusions. Heart
size is borderline enlarged. Upper mediastinal contours are within
normal limits. Atherosclerosis in the thoracic aorta.
IMPRESSION: 1. The appearance of the chest suggests developing congestive heart
failure, as above.

## 2017-09-02 ENCOUNTER — Other Ambulatory Visit (HOSPITAL_COMMUNITY): Payer: Self-pay | Admitting: Pulmonary Disease

## 2017-09-02 DIAGNOSIS — R609 Edema, unspecified: Secondary | ICD-10-CM

## 2017-09-03 ENCOUNTER — Ambulatory Visit (HOSPITAL_COMMUNITY)
Admission: RE | Admit: 2017-09-03 | Discharge: 2017-09-03 | Disposition: A | Discharge status: Home / Self Care | Payer: Medicare Other | Source: Ambulatory Visit | Attending: Pulmonary Disease | Admitting: Pulmonary Disease

## 2017-09-03 DIAGNOSIS — R609 Edema, unspecified: Secondary | ICD-10-CM | POA: Insufficient documentation

## 2017-09-03 NOTE — Progress Notes (Signed)
Preliminary results by tech - Left Lower Ext. Venous Duplex Completed. Negative for deep and superficial vein thrombosis in the left leg. A small Popliteal Cyst was noted. Results left on voice mail to call lab, Dr. Consuello BossierKilpatrick's office is closed on Friday. Marilynne Halstedita Anastazia Creek, BS, RDMS, RVT

## 2018-01-18 ENCOUNTER — Encounter: Payer: Self-pay | Admitting: Podiatry

## 2018-01-18 ENCOUNTER — Ambulatory Visit (INDEPENDENT_AMBULATORY_CARE_PROVIDER_SITE_OTHER): Payer: Medicare Other | Admitting: Podiatry

## 2018-01-18 DIAGNOSIS — E1140 Type 2 diabetes mellitus with diabetic neuropathy, unspecified: Secondary | ICD-10-CM

## 2018-01-18 DIAGNOSIS — D689 Coagulation defect, unspecified: Secondary | ICD-10-CM

## 2018-01-18 DIAGNOSIS — B351 Tinea unguium: Secondary | ICD-10-CM | POA: Diagnosis not present

## 2018-01-18 DIAGNOSIS — M79675 Pain in left toe(s): Secondary | ICD-10-CM

## 2018-01-18 DIAGNOSIS — M79674 Pain in right toe(s): Secondary | ICD-10-CM | POA: Diagnosis not present

## 2018-01-18 NOTE — Progress Notes (Signed)
Complaint:  Visit Type: Patient returns to my office for continued preventative foot care services. Complaint: Patient states" my nails have grown long and thick and become painful to walk and wear shoes" Patient has been diagnosed with DM with neuropathy.  This patient is also taking medicine causing coagulation defect The patient presents for preventative foot care services. No changes to ROS.  He  Is taking coumadin.  Podiatric Exam: Vascular: dorsalis pedis and posterior tibial pulses are minimally  palpable bilateral. Capillary return is immediate. Temperature gradient is WNL. Skin turgor WNL  Sensorium: Diminished  Semmes Weinstein monofilament test. Normal tactile sensation bilaterally. Nail Exam: Pt has thick disfigured discolored nails with subungual debris noted bilateral entire nail hallux through fifth toenails Ulcer Exam: There is no evidence of ulcer or pre-ulcerative changes or infection. Orthopedic Exam: Muscle tone and strength are WNL. No limitations in general ROM. No crepitus or effusions noted. Foot type and digits show no abnormalities. Bony prominences are unremarkable. Skin: No Porokeratosis. No infection or ulcers  Diagnosis:  Onychomycosis, , Pain in right toe, pain in left toes  Treatment & Plan Procedures and Treatment: Consent by patient was obtained for treatment procedures.   Debridement of mycotic and hypertrophic toenails, 1 through 5 bilateral and clearing of subungual debris. No ulceration, no infection noted. Told the patient that power step insoles might help the arthritis pain. By supporting his foot.   Return Visit-Office Procedure: Patient instructed to return to the office for a follow up visit 3 months for continued evaluation and treatment.    Helane GuntherGregory Benuel Ly DPM

## 2018-02-08 ENCOUNTER — Ambulatory Visit (INDEPENDENT_AMBULATORY_CARE_PROVIDER_SITE_OTHER): Payer: Medicare Other | Admitting: Physician Assistant

## 2018-02-08 ENCOUNTER — Encounter: Payer: Self-pay | Admitting: Physician Assistant

## 2018-02-08 VITALS — BP 136/70 | HR 86 | Ht 66.0 in | Wt 223.8 lb

## 2018-02-08 DIAGNOSIS — I10 Essential (primary) hypertension: Secondary | ICD-10-CM

## 2018-02-08 DIAGNOSIS — I350 Nonrheumatic aortic (valve) stenosis: Secondary | ICD-10-CM | POA: Diagnosis not present

## 2018-02-08 DIAGNOSIS — J449 Chronic obstructive pulmonary disease, unspecified: Secondary | ICD-10-CM | POA: Diagnosis not present

## 2018-02-08 DIAGNOSIS — Z8719 Personal history of other diseases of the digestive system: Secondary | ICD-10-CM

## 2018-02-08 DIAGNOSIS — I252 Old myocardial infarction: Secondary | ICD-10-CM

## 2018-02-08 DIAGNOSIS — Z86718 Personal history of other venous thrombosis and embolism: Secondary | ICD-10-CM | POA: Diagnosis not present

## 2018-02-08 NOTE — Patient Instructions (Signed)
Medication Instructions:  1. Your physician recommends that you continue on your current medications as directed. Please refer to the Current Medication list given to you today.    Labwork: NONE ORDERED TODAY  Testing/Procedures: Your physician has requested that you have an echocardiogram. Echocardiography is a painless test that uses sound waves to create images of your heart. It provides your doctor with information about the size and shape of your heart and how well your heart's chambers and valves are working. This procedure takes approximately one hour. There are no restrictions for this procedure. THIS IS TO BE DONE 1 WEEK BEFORE YOUR APPT [facility], [facility]     Follow-Up: Your physician wants you to follow-up in: 6 MONTHS [facility]  You will receive a reminder letter in the mail two months in advance. If you don't receive a letter, please call our office to schedule the follow-up appointment.   Any Other Special Instructions Will Be Listed Below (If Applicable).     If you need a refill on your cardiac medications before your next appointment, please call your pharmacy.

## 2018-02-08 NOTE — Progress Notes (Signed)
Cardiology Office Note:    Date:  02/08/2018   ID:  [name], DOB Sep 14, 1946, MRN [id number]  PCP:  Vincente Liberty, MD  Cardiologist:  Mertie Moores, MD   Referring MD: Vincente Liberty, MD   Chief Complaint  Patient presents with  . Follow-up    HTN, prior MI    History of Present Illness:    Gregory Walker is a 72 y.o. male with diabetes, hypertension, hyperlipidemia, COPD, prior stroke, prior left lower leg DVT, prior upper GI bleed.  He was admitted in November 2017 with respiratory arrest and non-ST elevation myocardial infarction in the setting of profound anemia (hemoglobin 5.7) while on anticoagulation with rivaroxaban.  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  The cardiogram demonstrated normal LV function and mild aortic stenosis.  It was thought that he would eventually need cardiac catheterization for further evaluation.  However, it was ultimately decided to proceed with nuclear stress test.  This was performed in June 2018 and demonstrated no evidence of ischemia or scar, EF 59%.  Medical therapy has been continued.  Last seen by Dr. Liam Rogers in 03/10/17.    Gregory Walker returns for follow up.  He is here with his daughter.  He denies any chest pain, syncope, paroxysmal nocturnal dyspnea.  He is chronically short of breath.  This is unchanged.  He denies any melena, hematochezia.    Prior CV studies:   The following studies were reviewed today:  Nuclear stress test 12/11/16 Low risk stress nuclear study with normal perfusion and normal left ventricular regional and global systolic function. EF 59  Echo 09/23/16 Moderate focal basal septal hypertrophy, EF 55-60, normal wall motion, mild aortic stenosis (mean gradient 12), mild MR, severe LAE, normal RVSF, mild TR, PASP 40  Echo 06/10/15 Mild LVH, EF 65-70, normal wall motion, grade 1 diastolic dysfunction, MAC  ABI 11/28/10 IMPRESSION: Normal ankle brachial indices.   No significant lower extremity occlusive peripheral vascular disease.  Past Medical History:  Diagnosis Date  . Anemia of chronic disease   . COPD (chronic obstructive pulmonary disease) ([place])   . Diabetes mellitus   . Diabetic neuropathy ([place])   . Diverticulosis   . History of DVT (deep vein thrombosis)    prior tx with Xarelto  . History of non-ST elevation myocardial infarction (NSTEMI) 10/06/2016   Occurred in setting of profound anemia and respiratory failure 09/2016 // Nuc stress test 12/11/16 Low risk stress nuclear study with normal perfusion and normal left ventricular regional and global systolic function. EF 59 // Echo 09/23/16 - Moderate focal basal septal hypertrophy, EF 55-60, no RWMA, mild AS (mean gradient 12), mild MR, severe LAE, normal RVSF, mild TR, PASP 40  . History of stroke   . Hyperlipidemia   . Hypertension   . Leukocytosis    Surgical Hx: The patient  has a past surgical history that includes Eye surgery; Esophagogastroduodenoscopy (N/A, 09/24/2016); and Colonoscopy with propofol (N/A, 09/25/2016).   Current Medications: Current Meds  Medication Sig  . ALPRAZolam (XANAX) 0.5 MG tablet Take 0.5 mg by mouth 2 (two) times daily.  Marland Kitchen aspirin EC 81 MG tablet Take 1 tablet (81 mg total) by mouth daily.  . budesonide-formoterol (SYMBICORT) 80-4.5 MCG/ACT inhaler Inhale 2 puffs into the lungs 2 (two) times daily.  . Choline Fenofibrate (FENOFIBRIC ACID) 135 MG CPDR Take 135 mg by mouth daily.   . cilostazol (PLETAL) 50 MG tablet Take 100 mg by mouth 2 (two) times daily.   Marland Kitchen  doxycycline (VIBRAMYCIN) 100 MG capsule Take 1 capsule (100 mg total) by mouth 2 (two) times daily.  Marland Kitchen ezetimibe (ZETIA) 10 MG tablet Take 10 mg by mouth daily.  . fluticasone (FLONASE) 50 MCG/ACT nasal spray Place 2 sprays into both nostrils 2 (two) times daily.   . furosemide (LASIX) 40 MG tablet Take 1 tablet (40 mg total) by mouth daily.  Marland Kitchen gabapentin (NEURONTIN) 300 MG capsule Take 300-900 mg  by mouth See admin instructions. 300 mg at lunchtime then 600 mg at suppertime then 900 mg at bedtime  . HUMALOG MIX 75/25 (75-25) 100 UNIT/ML SUSP injection Inject 30 Units into the skin 2 (two) times daily as needed (CBG >124).   Marland Kitchen oxyCODONE-acetaminophen (PERCOCET) 10-325 MG per tablet Take 1 tablet by mouth 5 (five) times daily.   . pioglitazone-metformin (ACTOPLUS MET) 15-850 MG per tablet Take 1 tablet by mouth at bedtime.   . saxagliptin HCl (ONGLYZA) 5 MG TABS tablet Take 5 mg by mouth daily.  . simvastatin (ZOCOR) 10 MG tablet Take 10 mg by mouth at bedtime.   . SYMPROIC 0.2 MG TABS Take 0.2 mg by mouth 2 (two) times daily.   . tamsulosin (FLOMAX) 0.4 MG CAPS capsule Take 0.4 mg by mouth daily.   Marland Kitchen tiZANidine (ZANAFLEX) 2 MG tablet Take 2 mg by mouth 2 (two) times daily.   . traMADol (ULTRAM) 50 MG tablet Take 1 tablet (50 mg total) by mouth daily. scheduled  . valsartan-hydrochlorothiazide (DIOVAN-HCT) 160-25 MG per tablet Take 1 tablet by mouth daily.   Marland Kitchen warfarin (COUMADIN) 5 MG tablet Take 5 mg by mouth.      Allergies:   Codeine   Social History   Tobacco Use  . Smoking status: Current Some Day Smoker    Packs/day: 0.50    Years: 45.00    Pack years: 22.50  . Smokeless tobacco: Current User    Types: Chew  . Tobacco comment: stopped smoking during hospital  Substance Use Topics  . Alcohol use: No  . Drug use: No     Family Hx: The patient's family history includes Bone cancer in his mother; Diabetes in his sister; Heart disease in his father.  ROS:   Please see the history of present illness.    Review of Systems  Constitution: Positive for malaise/fatigue.  Eyes: Positive for visual disturbance.  Cardiovascular: Positive for dyspnea on exertion and leg swelling.  Respiratory: Positive for cough, shortness of breath, snoring and wheezing.   Hematologic/Lymphatic: Bruises/bleeds easily.  Musculoskeletal: Positive for back pain, joint pain, joint swelling and  myalgias.  Gastrointestinal: Positive for constipation.  Neurological: Positive for loss of balance.   All other systems reviewed and are negative.   EKGs/Labs/Other Test Reviewed:    EKG:  EKG is  ordered today.  The ekg ordered today demonstrates normal sinus rhythm, HR 86, LAD, QTc 445 ms, no change from last tracing   Recent Labs: 03/26/2017: BUN 31; Creatinine, Ser 1.20; Hemoglobin 10.5; Platelets 445; Potassium 3.7; Sodium 137   From KPN Tool: Cholesterol, total  158.000 m  01/04/2018 HDL    61.000 mg  01/04/2018 LDL    79.000 mg  01/04/2018 Triglycerides   93.000 mg  01/04/2018 A1C    6.200 %   01/04/2018 Hemoglobin   10.300 g/  01/04/2018 Creatinine, Serum  1.200   03/26/2017 Potassium   3.500 mm  01/04/2018 ALT (SGPT)   15.000 U/L  01/04/2018  Recent Lipid Panel Lab Results  Component Value Date/Time  CHOL 120 09/24/2016 07:50 AM   TRIG 102 09/24/2016 07:50 AM   HDL 31 (L) 09/24/2016 07:50 AM   CHOLHDL 3.9 09/24/2016 07:50 AM   LDLCALC 69 09/24/2016 07:50 AM    Physical Exam:    VS:  BP 136/70   Pulse 86   Ht 5' 6" (1.676 m)   Wt 223 lb 12.8 oz (101.5 kg)   SpO2 97%   BMI 36.12 kg/m     Wt Readings from Last 3 Encounters:  02/08/18 223 lb 12.8 oz (101.5 kg)  03/15/17 210 lb (95.3 kg)  03/10/17 214 lb 12.8 oz (97.4 kg)     Physical Exam  Constitutional: He is oriented to person, place, and time. He appears well-developed and well-nourished. No distress.  HENT:  Head: Normocephalic and atraumatic.  Neck: No JVD present. Carotid bruit is not present.  Cardiovascular: Normal rate and regular rhythm.  Murmur heard.  Harsh systolic murmur is present with a grade of 2/6 at the upper right sternal border. Pulmonary/Chest: Effort normal. He has no rales.  Abdominal: Soft.  Musculoskeletal: He exhibits no edema.  Neurological: He is alert and oriented to person, place, and time.  Skin: Skin is warm and dry.    ASSESSMENT & PLAN:    #1.  History of non-ST  elevation myocardial infarction (NSTEMI) This occurred in the setting of profound anemia secondary to upper GI bleeding and respiratory failure.  Follow-up stress testing in 2018 demonstrated no ischemia or scar.  He is doing well without anginal symptoms.  Continue aspirin, statin.  #2.  History of GI bleed No apparent recurrence.  Recent hemoglobin stable.  #3.  History of DVT (deep vein thrombosis) Previously on Rivaroxaban.  He is now on warfarin which is managed by primary care.  He is also on Pletal for unclear reasons.  This is managed by primary care.  #4.  Essential hypertension Fair control.  Continue current therapy.  #5.  Chronic obstructive pulmonary disease, unspecified COPD type ([place]) Continue follow-up with primary care.  He is trying to quit smoking.  #6.  Aortic stenosis Arrange follow-up echocardiogram prior to next visit.   Dispo:  Return in about 6 months (around 08/11/2018) for Routine Follow Up, w/ Richardson Dopp, PA-C.   Medication Adjustments/Labs and Tests Ordered: Current medicines are reviewed at length with the patient today.  Concerns regarding medicines are outlined above.  Tests Ordered: Orders Placed This Encounter  Procedures  . EKG 12-Lead  . ECHOCARDIOGRAM COMPLETE   Medication Changes: No orders of the defined types were placed in this encounter.   Signed, Richardson Dopp, PA-C  02/08/2018 1:15 PM    [facility] [address], [place], [place]  [address] Phone: [phone]; Fax: [phone]

## 2018-02-19 ENCOUNTER — Emergency Department (HOSPITAL_BASED_OUTPATIENT_CLINIC_OR_DEPARTMENT_OTHER)
Admit: 2018-02-19 | Discharge: 2018-02-19 | Disposition: A | Discharge status: Home / Self Care | Payer: Medicare Other | Attending: Emergency Medicine | Admitting: Emergency Medicine

## 2018-02-19 ENCOUNTER — Emergency Department (HOSPITAL_COMMUNITY)
Admission: EM | Admit: 2018-02-19 | Discharge: 2018-02-19 | Disposition: A | Discharge status: Home / Self Care | Payer: Medicare Other | Attending: Emergency Medicine | Admitting: Emergency Medicine

## 2018-02-19 ENCOUNTER — Other Ambulatory Visit: Payer: Self-pay

## 2018-02-19 ENCOUNTER — Encounter (HOSPITAL_COMMUNITY): Payer: Self-pay | Admitting: Emergency Medicine

## 2018-02-19 ENCOUNTER — Emergency Department (HOSPITAL_COMMUNITY): Payer: Medicare Other

## 2018-02-19 DIAGNOSIS — F17220 Nicotine dependence, chewing tobacco, uncomplicated: Secondary | ICD-10-CM | POA: Diagnosis not present

## 2018-02-19 DIAGNOSIS — I10 Essential (primary) hypertension: Secondary | ICD-10-CM | POA: Diagnosis not present

## 2018-02-19 DIAGNOSIS — Z7901 Long term (current) use of anticoagulants: Secondary | ICD-10-CM | POA: Diagnosis not present

## 2018-02-19 DIAGNOSIS — Z7982 Long term (current) use of aspirin: Secondary | ICD-10-CM | POA: Insufficient documentation

## 2018-02-19 DIAGNOSIS — E119 Type 2 diabetes mellitus without complications: Secondary | ICD-10-CM | POA: Diagnosis not present

## 2018-02-19 DIAGNOSIS — M79672 Pain in left foot: Secondary | ICD-10-CM

## 2018-02-19 DIAGNOSIS — I252 Old myocardial infarction: Secondary | ICD-10-CM | POA: Insufficient documentation

## 2018-02-19 DIAGNOSIS — M79609 Pain in unspecified limb: Secondary | ICD-10-CM | POA: Diagnosis not present

## 2018-02-19 DIAGNOSIS — Z794 Long term (current) use of insulin: Secondary | ICD-10-CM | POA: Diagnosis not present

## 2018-02-19 DIAGNOSIS — J449 Chronic obstructive pulmonary disease, unspecified: Secondary | ICD-10-CM | POA: Insufficient documentation

## 2018-02-19 LAB — PROTIME-INR
INR: 1.16
PROTHROMBIN TIME: 14.7 s (ref 11.4–15.2)

## 2018-02-19 MED ORDER — WARFARIN SODIUM 2.5 MG PO TABS: ORAL_TABLET | ORAL | Status: AC

## 2018-02-19 MED ORDER — OXYCODONE-ACETAMINOPHEN 5-325 MG PO TABS: 1.0000 | ORAL_TABLET | Freq: Three times a day (TID) | ORAL | 0 refills | Status: AC | PRN

## 2018-02-19 NOTE — Discharge Instructions (Signed)
You can take Tylenol 1000 mg 3 times a day for pain.  Take the pain medication for severe breakthrough pain.  Do not take more than 4000 mg of Tylenol a day.  As we discussed, you need to have your primary care doctor re-evaluate your Coudmadin dose. For the next week, you will take a slighlty increased dose.  On Mondays, Wednesdays, Fridays, Saturdays, Sundays.  He will take one 5 mg Coumadin pill.  On Tuesday and Thursday, you will take 7.5 mg.  This will be 1 5 mg pill that you ready having one 2.5 mg pill daily today.  Follow-up with your primary foot doctor for further evaluation.  Return to the emergency department for worsening pain, fever, redness or swelling of the foot, worsening pain in the leg or any other worsening or concerning symptoms.

## 2018-02-19 NOTE — Progress Notes (Signed)
Left lower extremity venous duplex has been completed. Negative for DVT. Results were given to Graciella FreerLindsey Layden PA.  02/19/18 1:01 PM Olen CordialGreg Arvel Oquinn RVT

## 2018-02-19 NOTE — ED Triage Notes (Signed)
Pt. 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

## 2018-02-19 NOTE — ED Provider Notes (Signed)
[facility] EMERGENCY DEPARTMENT Provider Note   CSN: [id number] Arrival date & time: 02/19/18  5361     History   Chief Complaint Chief Complaint  Patient presents with  . Foot Pain    HPI Gregory Walker is a 72 y.o. male with PMH/o DVT noted to LLE, anemia, COPD, HTN who presents for evaluation of left foot pain that is been ongoing for the last few days.  He denies any preceding trauma, injury, fall.  Patient reports he has not noticed any redness or swelling overlying the foot.  He reports that most of his pain is located to the base of digits 4 through 1.  Patient reports he is not taking any medication for the pain.  Patient reports he was diagnosed with gout and states that he was unable to see his foot doctor this weekend, prompting ED visit.  Patient also reports some mild pain noted to the left calf.  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  Patient denies any fevers, chest pain, difficulty breathing.  Patient does have a history of neuropathy and has some intermittent numbness secondary to that.  He denies any changes in numbness or weakness.  The history is provided by the patient.    Past Medical History:  Diagnosis Date  . Anemia of chronic disease   . COPD (chronic obstructive pulmonary disease) ([place])   . Diabetes mellitus   . Diabetic neuropathy ([place])   . Diverticulosis   . History of DVT (deep vein thrombosis)    prior tx with Xarelto  . History of non-ST elevation myocardial infarction (NSTEMI) 10/06/2016   Occurred in setting of profound anemia and respiratory failure 09/2016 // Nuc stress test 12/11/16 Low risk stress nuclear study with normal perfusion and normal left ventricular regional and global systolic function. EF 59 // Echo 09/23/16 - Moderate focal basal septal hypertrophy, EF 55-60, no RWMA, mild AS (mean gradient 12), mild MR, severe LAE, normal RVSF, mild TR, PASP 40    . History of stroke   . Hyperlipidemia   . Hypertension   . Leukocytosis     Patient Active Problem List   Diagnosis Date Noted  . COPD (chronic obstructive pulmonary disease) ([place]) 02/08/2018  . History of non-ST elevation myocardial infarction (NSTEMI) 10/06/2016  . Diverticulosis of colon without hemorrhage   . History of GI bleed 09/23/2016  . Cardiac ischemia   . Symptomatic anemia 09/15/2016  . History of CVA (cerebrovascular accident) 09/15/2016  . History of DVT (deep vein thrombosis) 09/15/2016  . Cellulitis and abscess 12/28/2015  . Hyperlipidemia 12/28/2015  . Leukocytosis 12/28/2015  . Anemia of chronic disease 12/28/2015  . Abscess of right hand including fingers 12/28/2015  . Diabetes ([place]) 06/08/2015  . HTN (hypertension) 06/08/2015    Past Surgical History:  Procedure Laterality Date  . COLONOSCOPY WITH PROPOFOL N/A 09/25/2016   Procedure: COLONOSCOPY WITH PROPOFOL;  Surgeon: Milus Banister, MD;  Location: [facility];  Service: Endoscopy;  Laterality: N/A;  . ESOPHAGOGASTRODUODENOSCOPY N/A 09/24/2016   Procedure: ESOPHAGOGASTRODUODENOSCOPY (EGD);  Surgeon: Milus Banister, MD;  Location: [facility];  Service: Endoscopy;  Laterality: N/A;  . EYE SURGERY          Home Medications    Prior to Admission medications   Medication Sig Start Date End Date Taking? Authorizing Provider  ALPRAZolam Duanne Moron) 0.5 MG tablet Take 0.5 mg by mouth 2 (two) times  daily. 05/15/15   [provider]  aspirin EC 81 MG tablet Take 1 tablet (81 mg total) by mouth daily. 12/08/16   Burtis Junes, NP  budesonide-formoterol (SYMBICORT) 80-4.5 MCG/ACT inhaler Inhale 2 puffs into the lungs 2 (two) times daily.    [provider]  Choline Fenofibrate (FENOFIBRIC ACID) 135 MG CPDR Take 135 mg by mouth daily.  05/30/15   [provider]  cilostazol (PLETAL) 50 MG tablet Take 100 mg by mouth 2 (two) times daily.  06/06/15   [provider]   doxycycline (VIBRAMYCIN) 100 MG capsule Take 1 capsule (100 mg total) by mouth 2 (two) times daily. 03/15/17   Law, Bea Graff, PA-C  ezetimibe (ZETIA) 10 MG tablet Take 10 mg by mouth daily.    [provider]  fluticasone (FLONASE) 50 MCG/ACT nasal spray Place 2 sprays into both nostrils 2 (two) times daily.  05/30/15   [provider]  furosemide (LASIX) 40 MG tablet Take 1 tablet (40 mg total) by mouth daily. 12/08/16 02/08/18  Burtis Junes, NP  gabapentin (NEURONTIN) 300 MG capsule Take 300-900 mg by mouth See admin instructions. 300 mg at lunchtime then 600 mg at suppertime then 900 mg at bedtime 05/30/15   [provider]  HUMALOG MIX 75/25 (75-25) 100 UNIT/ML SUSP injection Inject 30 Units into the skin 2 (two) times daily as needed (CBG >124).  03/25/15   [provider]  oxyCODONE-acetaminophen (PERCOCET/ROXICET) 5-325 MG tablet Take 1-2 tablets by mouth every 8 (eight) hours as needed for severe pain. 02/19/18   Volanda Napoleon, PA-C  pioglitazone-metformin (ACTOPLUS MET) 15-850 MG per tablet Take 1 tablet by mouth at bedtime.  05/14/15   [provider]  saxagliptin HCl (ONGLYZA) 5 MG TABS tablet Take 5 mg by mouth daily.    [provider]  simvastatin (ZOCOR) 10 MG tablet Take 10 mg by mouth at bedtime.  05/27/15   [provider]  SYMPROIC 0.2 MG TABS Take 0.2 mg by mouth 2 (two) times daily.  03/02/17   [provider]  tamsulosin (FLOMAX) 0.4 MG CAPS capsule Take 0.4 mg by mouth daily.  06/06/15   [provider]  tiZANidine (ZANAFLEX) 2 MG tablet Take 2 mg by mouth 2 (two) times daily.  05/29/15   [provider]  traMADol (ULTRAM) 50 MG tablet Take 1 tablet (50 mg total) by mouth daily. scheduled 03/26/17   Dorie Rank, MD  valsartan-hydrochlorothiazide (DIOVAN-HCT) 160-25 MG per tablet Take 1 tablet by mouth daily.  05/27/15   [provider]  warfarin (COUMADIN) 2.5 MG tablet Take 1 tablet  by mouth on Tuesday and  Thurdsay 02/19/18   Providence Lanius A, PA-C  warfarin (COUMADIN) 5 MG tablet Take 5 mg by mouth.  02/16/17   [provider]    Family History Family History  Problem Relation Age of Onset  . Bone cancer Mother   . Heart disease Father   . Diabetes Sister     Social History Social History   Tobacco Use  . Smoking status: Current Some Day Smoker    Packs/day: 0.50    Years: 45.00    Pack years: 22.50  . Smokeless tobacco: Current User    Types: Chew  . Tobacco comment: stopped smoking during hospital  Substance Use Topics  . Alcohol use: No  . Drug use: No     Allergies   Codeine   Review of Systems Review of Systems  Constitutional:  Negative for fever.  Respiratory: Negative for shortness of breath.   Cardiovascular: Negative for chest pain.  Musculoskeletal:       Foot pain  Skin: Negative for color change.     Physical Exam Updated Vital Signs BP 113/68   Pulse 74   Temp 98 F (36.7 C) (Oral)   Resp 18   Ht 5' 6"  (1.676 m)   Wt 95.3 kg (210 lb)   SpO2 97%   BMI 33.89 kg/m   Physical Exam  Constitutional: He appears well-developed and well-nourished.  HENT:  Head: Normocephalic and atraumatic.  Eyes: Conjunctivae and EOM are normal. Right eye exhibits no discharge. Left eye exhibits no discharge. No scleral icterus.  Cardiovascular:  Pulses:      Dorsalis pedis pulses are 2+ on the right side, and 2+ on the left side.  Bedside Doppler confirmed good DP pulses bilaterally.  Pulmonary/Chest: Effort normal.  Musculoskeletal:  Diffuse tenderness noted to the base of digits 1 through 4.  No deformity or crepitus noted.  No overlying warmth, erythema.  No tenderness palpation to left ankle.  Mild tenderness palpation noted to the left calf.  Overlying warmth, soft tissue swelling, erythema.  Bilateral lower extremities appear symmetric in appearance.  Neurological: He is alert.  Sensation intact along major nerve  distributions of BLE  Skin: Skin is warm and dry. Capillary refill takes less than 2 seconds.  Good distal cap refill. LLE is not dusky in appearance or cool to touch.   Psychiatric: He has a normal mood and affect. His speech is normal and behavior is normal.  Nursing note and vitals reviewed.    ED Treatments / Results  Labs (all labs ordered are listed, but only abnormal results are displayed) Labs Reviewed  PROTIME-INR    EKG None  Radiology Dg Foot Complete Left  Result Date: 02/19/2018 CLINICAL DATA:  Left foot pain and history of gout. EXAM: LEFT FOOT - COMPLETE 3+ VIEW COMPARISON:  None. FINDINGS: There is no evidence of fracture or dislocation. Degenerative disease present of the toes, most prominently involving the first MTP joint. No bony erosions or destruction present. No bony lesions. Soft tissues are unremarkable. IMPRESSION: No acute findings. Degenerative disease of the toes, most prominently at the first MTP joint. Electronically Signed   By: Aletta Edouard M.D.   On: 02/19/2018 11:04    Procedures Procedures (including critical care time)  Medications Ordered in ED Medications - No data to display   Initial Impression / Assessment and Plan / ED Course  I have reviewed the triage vital signs and the nursing notes.  Pertinent labs & imaging results that were available during my care of the patient were reviewed by me and considered in my medical decision making (see chart for details).     72 year old male who presents for evaluation of left foot pain is been ongoing for the last 2 days.  Reports a history of gout and states that he sees a foot doctor but they were not open in the last weekend.  Denies any preceding trauma, injury.  Patient reports he does have a history of DVT to left lower extremity.  Is reporting some calf tenderness palpation today on exam.  No chest pain, difficulty breathing, numbness/weakness of his extremities. Patient is afebrile,  non-toxic appearing, sitting comfortably on examination table. Vital signs reviewed and stable. Patient is neurovascularly intact.  Good palpable DP pulses bilaterally.  Good pulses confirmed by bedside Doppler.  Consider gout versus  arthritis.  Low suspicion for DVT but given patient's history also consideration.  We will plan to check INR and get x-ray.  INR is 1.6.  Given that patient is subtherapeutic does have some calf tenderness, will plan for ultrasound evaluation of left lower extremity to evaluate for DVT.  X-ray reviewed.  Negative for any acute abnormality.  There is some degenerative changes noted to the metatarsals.  Ultrasound reviewed.  Negative for any acute DVT.  I discussed results with patient.  Review of patient's record shows no diagnosed history of gout.  I cannot find any record of him ever being on colchicine or indomethacin.  Patient reports he has been on gout medication before but does not recall the name of it.  Given that patient has no prior history of diagnosed gout, being on any gout medications, hesitant to start any gout medication here in the ED.  Suspect that patient's pain might be due to degenerative changes, particularly noting the distribution of his pain.  We will plan to treat pain symptomatically.  We will plan to mildly increase his warfarin.  Patient does have a history of GI bleed from being on Xarelto.  He has not had any issues since being on the Coumadin.  We will plan to mildly increase it and have him follow-up with his primary care doctor for further evaluation.  I discussed results with patient.  He is agreeable to plan.  Repeat vitals are stable. Patient had ample opportunity for questions and discussion. All patient's questions were answered with full understanding. Strict return precautions discussed. Patient expresses understanding and agreement to plan.   Final Clinical Impressions(s) / ED Diagnoses   Final diagnoses:  Left foot pain    ED  Discharge Orders        Ordered    oxyCODONE-acetaminophen (PERCOCET/ROXICET) 5-325 MG tablet  Every 8 hours PRN     02/19/18 1335    warfarin (COUMADIN) 2.5 MG tablet     02/19/18 1340       Volanda Napoleon, PA-C 02/19/18 1736    Pattricia Boss, MD 02/23/18 1430

## 2018-02-23 IMAGING — DX DG KNEE COMPLETE 4+V*R*
4 series · 4 of 4 positions shown · non-contrast
Comparison: None ; prior exam on the time line from 07/05/2011 does
not load for comparison.

CLINICAL DATA: Generalized RIGHT knee pain for 2 weeks, history of
blood clots in RIGHT leg for months ago, history diabetes mellitus,
hypertension, coronary disease post MI, COPD

EXAM:
RIGHT KNEE - COMPLETE 4+ VIEW

[knee ap]
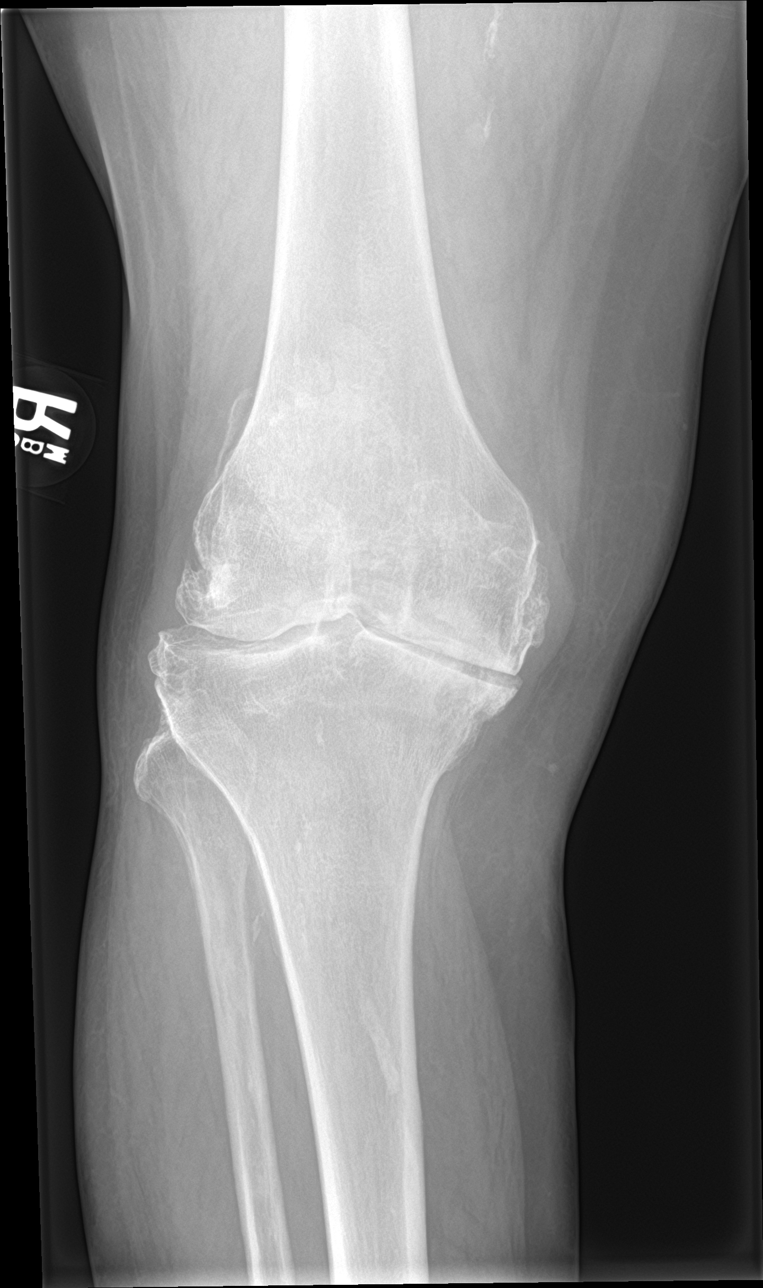

[knee lat]
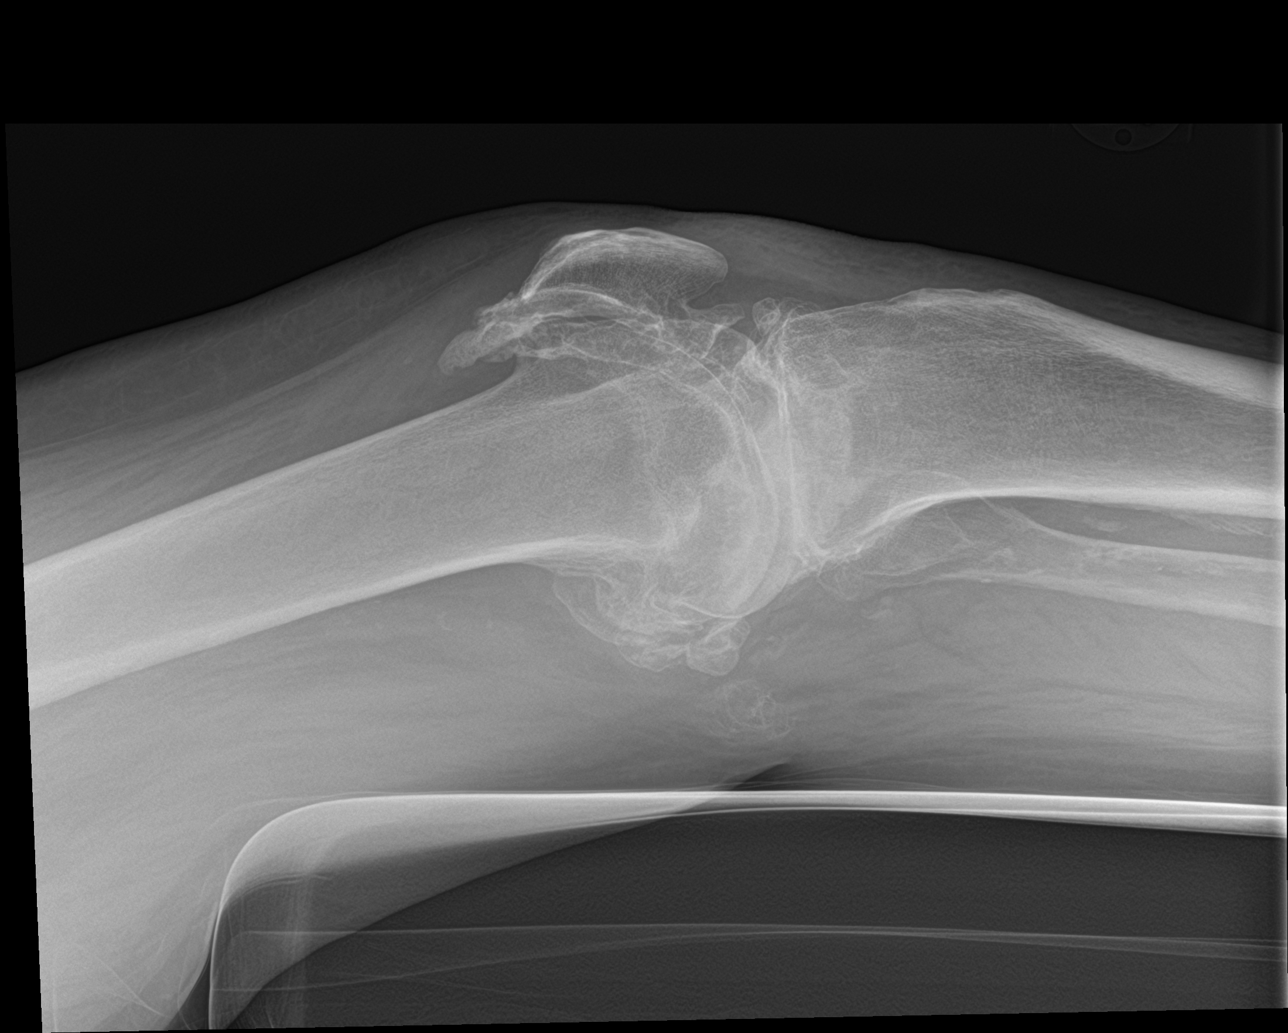

[knee obl (1 of 2)]
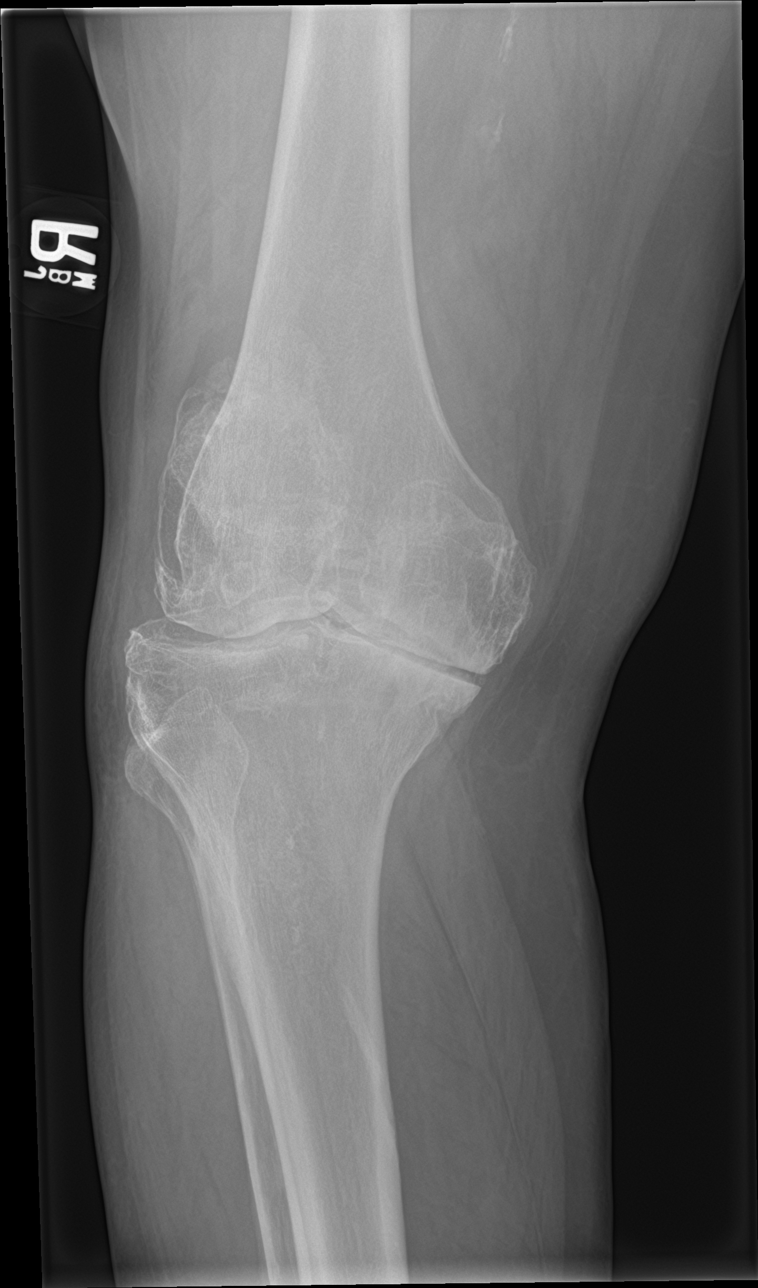

[knee obl (2 of 2)]
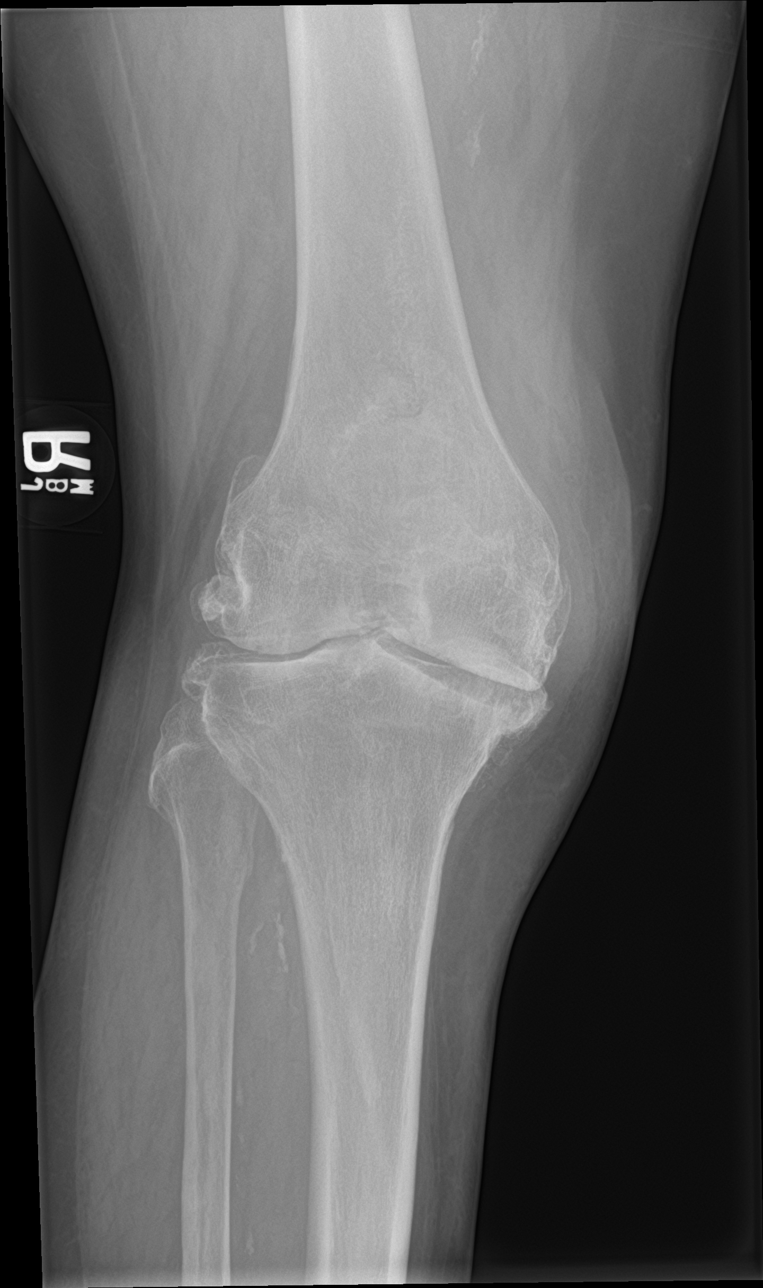

[4 of 4 positions shown; findings below may reference images not displayed]

FINDINGS: Osseous demineralization.

Advanced tricompartmental osteoarthritic changes with joint space
narrowing and spur formation.

No acute fracture, dislocation, or bone destruction.

No knee joint effusion.
IMPRESSION: Advanced tricompartmental osteoarthritic changes RIGHT knee.

No acute bony abnormalities.

## 2018-02-23 IMAGING — DX DG TIBIA/FIBULA 2V*R*
2 series · 2 of 2 positions shown · non-contrast
Comparison: None.

CLINICAL DATA: Generalized right knee pain for the past 2 weeks. No
known injury.

EXAM:
RIGHT TIBIA AND FIBULA - 2 VIEW

[tibia ap]
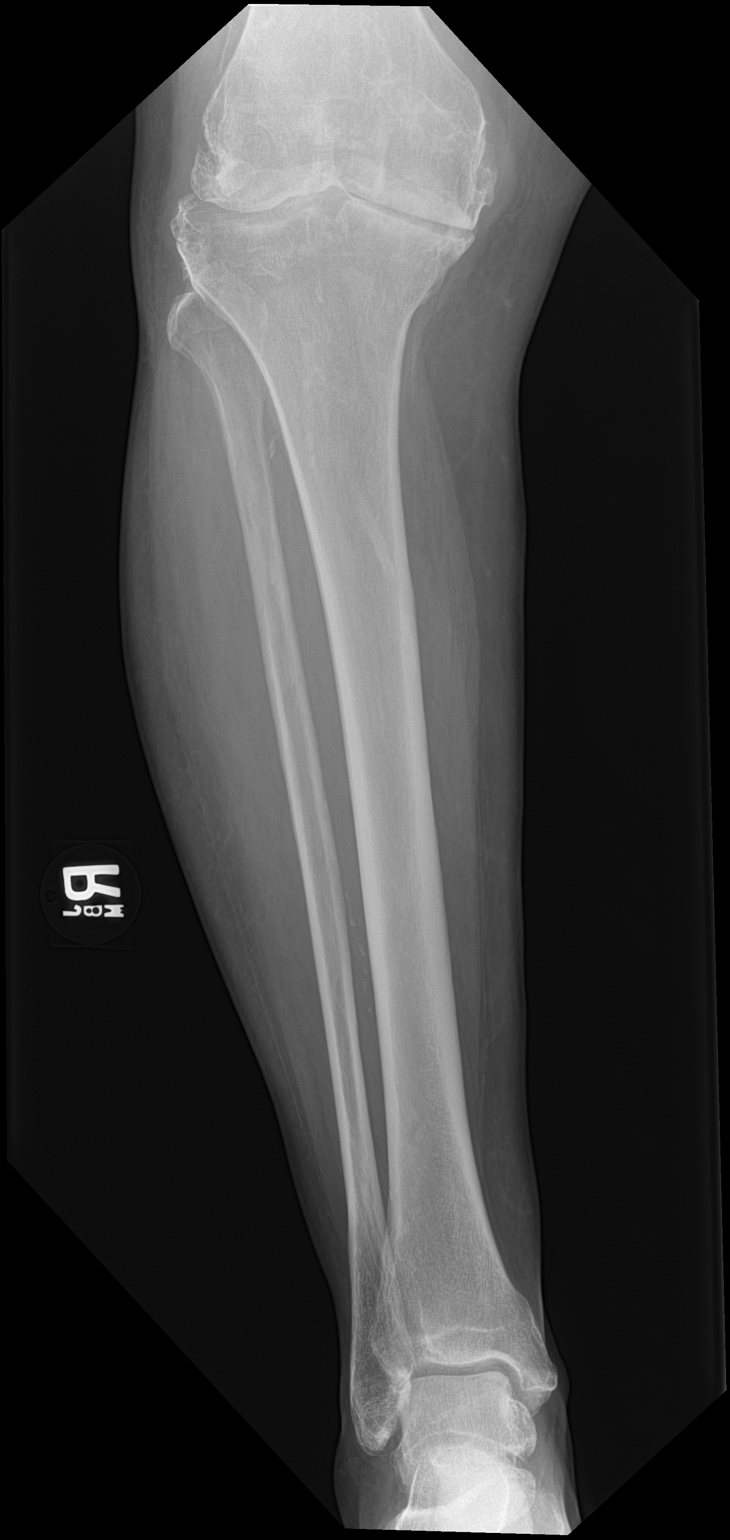

[tibia lat]
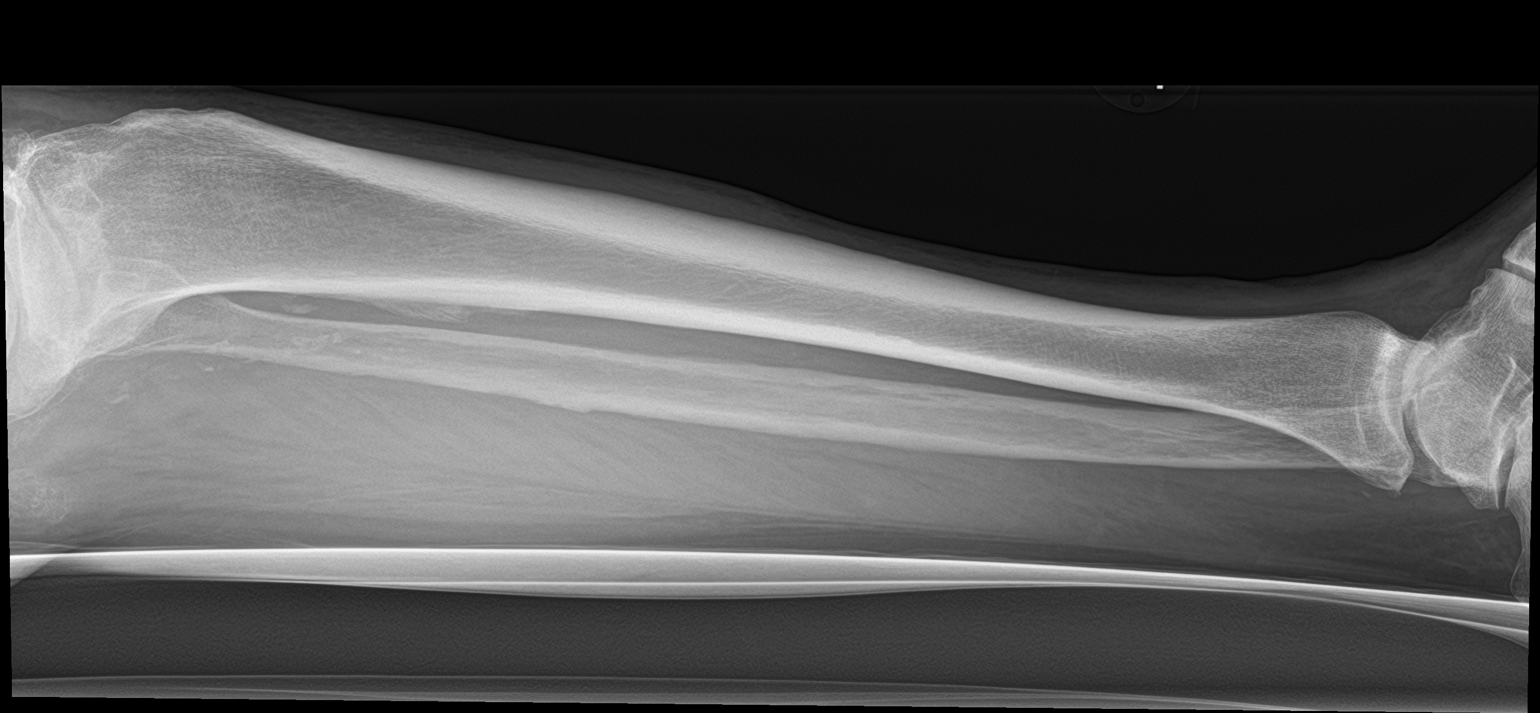

[2 of 2 positions shown; findings below may reference images not displayed]

FINDINGS: Extensive right knee degenerative changes. These are described
separately in the right knee radiographs report. Otherwise,
unremarkable bones and soft tissues.
IMPRESSION: Extensive right knee degenerative changes.  No acute abnormality.

## 2018-03-02 ENCOUNTER — Ambulatory Visit
Admission: RE | Admit: 2018-03-02 | Discharge: 2018-03-02 | Disposition: A | Discharge status: Home / Self Care | Payer: Medicare Other | Source: Ambulatory Visit | Attending: Physician Assistant | Admitting: Physician Assistant

## 2018-03-02 ENCOUNTER — Other Ambulatory Visit: Payer: Self-pay | Admitting: Physician Assistant

## 2018-03-02 DIAGNOSIS — M25562 Pain in left knee: Secondary | ICD-10-CM

## 2018-03-26 ENCOUNTER — Emergency Department (HOSPITAL_COMMUNITY)
Admission: EM | Admit: 2018-03-26 | Discharge: 2018-03-26 | Disposition: A | Discharge status: Home / Self Care | Payer: Medicare Other | Attending: Emergency Medicine | Admitting: Emergency Medicine

## 2018-03-26 ENCOUNTER — Encounter (HOSPITAL_COMMUNITY): Payer: Self-pay

## 2018-03-26 ENCOUNTER — Other Ambulatory Visit: Payer: Self-pay

## 2018-03-26 ENCOUNTER — Emergency Department (HOSPITAL_BASED_OUTPATIENT_CLINIC_OR_DEPARTMENT_OTHER): Admit: 2018-03-26 | Discharge: 2018-03-26 | Disposition: A | Discharge status: Home / Self Care | Payer: Medicare Other

## 2018-03-26 DIAGNOSIS — Z7901 Long term (current) use of anticoagulants: Secondary | ICD-10-CM | POA: Diagnosis not present

## 2018-03-26 DIAGNOSIS — E1140 Type 2 diabetes mellitus with diabetic neuropathy, unspecified: Secondary | ICD-10-CM | POA: Diagnosis not present

## 2018-03-26 DIAGNOSIS — M7989 Other specified soft tissue disorders: Secondary | ICD-10-CM

## 2018-03-26 DIAGNOSIS — M79672 Pain in left foot: Secondary | ICD-10-CM

## 2018-03-26 DIAGNOSIS — Z79899 Other long term (current) drug therapy: Secondary | ICD-10-CM | POA: Diagnosis not present

## 2018-03-26 DIAGNOSIS — F17220 Nicotine dependence, chewing tobacco, uncomplicated: Secondary | ICD-10-CM | POA: Insufficient documentation

## 2018-03-26 DIAGNOSIS — J449 Chronic obstructive pulmonary disease, unspecified: Secondary | ICD-10-CM | POA: Insufficient documentation

## 2018-03-26 DIAGNOSIS — Z794 Long term (current) use of insulin: Secondary | ICD-10-CM | POA: Diagnosis not present

## 2018-03-26 DIAGNOSIS — F17200 Nicotine dependence, unspecified, uncomplicated: Secondary | ICD-10-CM | POA: Insufficient documentation

## 2018-03-26 DIAGNOSIS — M79609 Pain in unspecified limb: Secondary | ICD-10-CM

## 2018-03-26 DIAGNOSIS — Z7982 Long term (current) use of aspirin: Secondary | ICD-10-CM | POA: Diagnosis not present

## 2018-03-26 DIAGNOSIS — I10 Essential (primary) hypertension: Secondary | ICD-10-CM | POA: Diagnosis not present

## 2018-03-26 NOTE — ED Notes (Signed)
Patient left without receiving discharge papers, stating he didn't want to wait any longer. MD made aware - patient ambulatory with steady gait.

## 2018-03-26 NOTE — ED Notes (Addendum)
Pt. In waiting room, putting on shoes, states that he don't wanna stay any longer. I tried to convince him to stay, but refused.

## 2018-03-26 NOTE — ED Provider Notes (Signed)
[facility] EMERGENCY DEPARTMENT Provider Note   CSN: [id number] Arrival date & time: 03/26/18  1256     History   Chief Complaint Chief Complaint  Patient presents with  . Foot Pain    HPI Gregory Walker is a 72 y.o. male.  Pt p/w 10 days of left foot pain. Hx of DVT in L leg, on Warfarin (1 year). No trauma to foot. Also w/ hx of gout.  The history is provided by the patient.  Foot Pain  This is a recurrent problem. The current episode started more than 1 week ago. The problem occurs constantly. The problem has not changed since onset.Pertinent negatives include no chest pain, no abdominal pain, no headaches and no shortness of breath. Exacerbated by: touching top of foot, walking. Nothing relieves the symptoms.    Past Medical History:  Diagnosis Date  . Anemia of chronic disease   . COPD (chronic obstructive pulmonary disease) ([place])   . Diabetes mellitus   . Diabetic neuropathy ([place])   . Diverticulosis   . History of DVT (deep vein thrombosis)    prior tx with Xarelto  . History of non-ST elevation myocardial infarction (NSTEMI) 10/06/2016   Occurred in setting of profound anemia and respiratory failure 09/2016 // Nuc stress test 12/11/16 Low risk stress nuclear study with normal perfusion and normal left ventricular regional and global systolic function. EF 59 // Echo 09/23/16 - Moderate focal basal septal hypertrophy, EF 55-60, no RWMA, mild AS (mean gradient 12), mild MR, severe LAE, normal RVSF, mild TR, PASP 40  . History of stroke   . Hyperlipidemia   . Hypertension   . Leukocytosis     Patient Active Problem List   Diagnosis Date Noted  . COPD (chronic obstructive pulmonary disease) ([place]) 02/08/2018  . History of non-ST elevation myocardial infarction (NSTEMI) 10/06/2016  . Diverticulosis of colon without hemorrhage   . History of GI bleed 09/23/2016  . Cardiac ischemia   . Symptomatic anemia 09/15/2016  . History of CVA (cerebrovascular  accident) 09/15/2016  . History of DVT (deep vein thrombosis) 09/15/2016  . Cellulitis and abscess 12/28/2015  . Hyperlipidemia 12/28/2015  . Leukocytosis 12/28/2015  . Anemia of chronic disease 12/28/2015  . Abscess of right hand including fingers 12/28/2015  . Diabetes ([place]) 06/08/2015  . HTN (hypertension) 06/08/2015    Past Surgical History:  Procedure Laterality Date  . COLONOSCOPY WITH PROPOFOL N/A 09/25/2016   Procedure: COLONOSCOPY WITH PROPOFOL;  Surgeon: Milus Banister, MD;  Location: [facility];  Service: Endoscopy;  Laterality: N/A;  . ESOPHAGOGASTRODUODENOSCOPY N/A 09/24/2016   Procedure: ESOPHAGOGASTRODUODENOSCOPY (EGD);  Surgeon: Milus Banister, MD;  Location: [facility];  Service: Endoscopy;  Laterality: N/A;  . EYE SURGERY          Home Medications    Prior to Admission medications   Medication Sig Start Date End Date Taking? Authorizing Provider  ALPRAZolam Duanne Moron) 0.5 MG tablet Take 0.5 mg by mouth 2 (two) times daily. 05/15/15   [provider]  aspirin EC 81 MG tablet Take 1 tablet (81 mg total) by mouth daily. 12/08/16   Burtis Junes, NP  budesonide-formoterol (SYMBICORT) 80-4.5 MCG/ACT inhaler Inhale 2 puffs into the lungs 2 (two) times daily.    [provider]  Choline Fenofibrate (FENOFIBRIC ACID) 135 MG CPDR Take 135 mg by mouth daily.  05/30/15   [provider]  cilostazol (PLETAL) 50 MG tablet Take 100 mg by mouth 2 (two) times daily.  06/06/15   [provider]  doxycycline (VIBRAMYCIN) 100 MG capsule Take 1 capsule (100 mg total) by mouth 2 (two) times daily. 03/15/17   Law, Bea Graff, PA-C  ezetimibe (ZETIA) 10 MG tablet Take 10 mg by mouth daily.    [provider]  fluticasone (FLONASE) 50 MCG/ACT nasal spray Place 2 sprays into both nostrils 2 (two) times daily.  05/30/15   [provider]  furosemide (LASIX) 40 MG tablet Take 1 tablet (40 mg total) by mouth daily. 12/08/16 02/08/18   Burtis Junes, NP  gabapentin (NEURONTIN) 300 MG capsule Take 300-900 mg by mouth See admin instructions. 300 mg at lunchtime then 600 mg at suppertime then 900 mg at bedtime 05/30/15   [provider]  HUMALOG MIX 75/25 (75-25) 100 UNIT/ML SUSP injection Inject 30 Units into the skin 2 (two) times daily as needed (CBG >124).  03/25/15   [provider]  oxyCODONE-acetaminophen (PERCOCET/ROXICET) 5-325 MG tablet Take 1-2 tablets by mouth every 8 (eight) hours as needed for severe pain. 02/19/18   Volanda Napoleon, PA-C  pioglitazone-metformin (ACTOPLUS MET) 15-850 MG per tablet Take 1 tablet by mouth at bedtime.  05/14/15   [provider]  saxagliptin HCl (ONGLYZA) 5 MG TABS tablet Take 5 mg by mouth daily.    [provider]  simvastatin (ZOCOR) 10 MG tablet Take 10 mg by mouth at bedtime.  05/27/15   [provider]  SYMPROIC 0.2 MG TABS Take 0.2 mg by mouth 2 (two) times daily.  03/02/17   [provider]  tamsulosin (FLOMAX) 0.4 MG CAPS capsule Take 0.4 mg by mouth daily.  06/06/15   [provider]  tiZANidine (ZANAFLEX) 2 MG tablet Take 2 mg by mouth 2 (two) times daily.  05/29/15   [provider]  traMADol (ULTRAM) 50 MG tablet Take 1 tablet (50 mg total) by mouth daily. scheduled 03/26/17   Dorie Rank, MD  valsartan-hydrochlorothiazide (DIOVAN-HCT) 160-25 MG per tablet Take 1 tablet by mouth daily.  05/27/15   [provider]  warfarin (COUMADIN) 2.5 MG tablet Take 1 tablet by mouth on Tuesday and  Thurdsay 02/19/18   Providence Lanius A, PA-C  warfarin (COUMADIN) 5 MG tablet Take 5 mg by mouth.  02/16/17   [provider]    Family History Family History  Problem Relation Age of Onset  . Bone cancer Mother   . Heart disease Father   . Diabetes Sister     Social History Social History   Tobacco Use  . Smoking status: Current Some Day Smoker    Packs/day: 0.50    Years: 45.00    Pack years: 22.50  .  Smokeless tobacco: Current User    Types: Chew  . Tobacco comment: stopped smoking during hospital  Substance Use Topics  . Alcohol use: No  . Drug use: No     Allergies   Codeine   Review of Systems Review of Systems  Constitutional: Negative for chills and fever.  HENT: Negative for ear pain and sore throat.   Eyes: Negative for pain and visual disturbance.  Respiratory: Negative for cough and shortness of breath.   Cardiovascular: Positive for leg swelling. Negative for chest pain and palpitations.  Gastrointestinal: Negative for abdominal pain and vomiting.  Genitourinary: Negative for dysuria and hematuria.  Musculoskeletal: Positive for arthralgias. Negative for back pain.  Skin: Negative for color change, rash and wound.  Neurological: Negative for seizures, syncope and headaches.  All  other systems reviewed and are negative.    Physical Exam Updated Vital Signs BP 133/69 (BP Location: Right Arm)   Pulse 63   Temp 98 F (36.7 C) (Oral)   Resp 18   Ht _0  (1.676 m)   Wt 95.3 kg (210 lb)   SpO2 100%   BMI 33.89 kg/m   Physical Exam  Constitutional: He appears well-developed and well-nourished.  HENT:  Head: Normocephalic and atraumatic.  Eyes: Conjunctivae are normal.  Neck: Neck supple.  Cardiovascular: Normal rate and regular rhythm.  No murmur heard. Pulmonary/Chest: Effort normal and breath sounds normal. No respiratory distress.  Abdominal: Soft. There is no tenderness.  Musculoskeletal: He exhibits tenderness. He exhibits no edema or deformity.  There is mild swelling to LLE from knee to foot. Mild tenderness over dorsum of left midfoot. Feet are NVI bilaterally. No erythematous changes, wounds, or rash.   Neurological: He is alert.  Skin: Skin is warm and dry.  Psychiatric: He has a normal mood and affect.  Nursing note and vitals reviewed.    ED Treatments / Results  Labs (all labs ordered are listed, but only abnormal results are  displayed) Labs Reviewed - No data to display  EKG None  Radiology No results found.  Procedures Procedures (including critical care time)  Medications Ordered in ED Medications - No data to display   Initial Impression / Assessment and Plan / ED Course  I have reviewed the triage vital signs and the nursing notes.  Pertinent labs & imaging results that were available during my care of the patient were reviewed by me and considered in my medical decision making (see chart for details).     Patient is a 72 year old male with history as above, notable for prior DVT still on warfarin as well as prior history of gout, who presents with 10 days of left foot pain.  He denies any recent injury.  He reports this feels similar to when he had a blood clot previously.  He was seen in the first look process where ultrasound was ordered of his bilateral lower extremities.  I have reviewed this report and there is no evidence of DVT.  Does have evidence of a Baker's cyst on the right.  His lower extremity exam is notable for tenderness over the dorsal aspect of his left midfoot.  There are no open wounds or skin changes to suggest cellulitis.  There is no significant joint erythema to suggest gout.  Prior to me having an opportunity to discuss the results with the patient, he eloped from the emergency department.  Even that there were no emergent findings on his work-up, no further treatment or notification is indicated at this time.  Final Clinical Impressions(s) / ED Diagnoses   Final diagnoses:  Foot pain, left    ED Discharge Orders    None       Clifton James, MD 03/26/18 2102    Pattricia Boss, MD 03/27/18 0003

## 2018-03-26 NOTE — ED Triage Notes (Signed)
Pt endorses left foot pain x 10 days. Had hx of dvt in same leg x 1 year ago. States this feels the same. Has also had pain behind the right knee cap recently but it went away. Pedal pulse present.

## 2018-03-26 NOTE — Progress Notes (Signed)
*  PRELIMINARY RESULTS* Vascular Ultrasound Bilateral lower extremity venous duplex has been completed.  Preliminary findings:   Right: There is no evidence of deep vein thrombosis in the lower extremity. However, portions of this examination were limited- see technologist comments above. A cystic structure is found in the popliteal fossa. Complex bakers cysts with calcified components.  Left: There is no evidence of deep vein thrombosis in the lower extremity. However, portions of this examination were limited- see technologist comments above. No cystic structure found in the popliteal fossa. Atherosclerotic changes seen in the femoral vein bilaterally.    Chauncey Fischer 03/26/2018, 3:35 PM

## 2018-04-20 ENCOUNTER — Ambulatory Visit: Payer: Medicare Other | Admitting: Podiatry

## 2021-04-09 ENCOUNTER — Other Ambulatory Visit: Payer: Self-pay | Admitting: Nurse Practitioner

## 2021-04-09 DIAGNOSIS — R2242 Localized swelling, mass and lump, left lower limb: Secondary | ICD-10-CM

## 2021-04-24 ENCOUNTER — Ambulatory Visit
Admission: RE | Admit: 2021-04-24 | Discharge: 2021-04-24 | Disposition: A | Discharge status: Home / Self Care | Payer: Medicare Other | Source: Ambulatory Visit | Attending: Nurse Practitioner | Admitting: Nurse Practitioner

## 2021-04-24 DIAGNOSIS — R2242 Localized swelling, mass and lump, left lower limb: Secondary | ICD-10-CM

## 2022-02-05 ENCOUNTER — Ambulatory Visit: Payer: Medicare Other | Admitting: Physician Assistant

## 2022-02-15 NOTE — Progress Notes (Deleted)
?Cardiology Office Note:   ?Date:  02/15/2022  ?NAME:  Gregory Walker    ?MRN: [id number] ?DOB:  Nov 06, 1946  ? ?PCP:  Corine Shelter, MD  ?Cardiologist:  Kristeen Miss, MD  ?Electrophysiologist:  None  ? ?Referring MD: Corine Shelter, MD  ? ?No chief complaint on file. ?*** ? ?History of Present Illness:   ?Gregory Walker is a 76 y.o. male with a hx of DM, HTN, HLD who is being seen today for the evaluation of aortic stenosis at the request of Corine Shelter, MD. ? ?Problem List ?DM ?HTN ?CVA ?DVT ?NSTEMI/Demand Ischemia  ?-09/2016 -> GIB with HGB 5.7 ?-NM Stress test normal  ?6. Aortic stenosis ?-mild 2017 ? ?Past Medical History: ?Past Medical History:  ?Diagnosis Date  ? Anemia of chronic disease   ? COPD (chronic obstructive pulmonary disease) (HCC)   ? Diabetes mellitus   ? Diabetic neuropathy (HCC)   ? Diverticulosis   ? History of DVT (deep vein thrombosis)   ? prior tx with Xarelto  ? History of non-ST elevation myocardial infarction (NSTEMI) 10/06/2016  ? Occurred in setting of profound anemia and respiratory failure 09/2016 // Nuc stress test 12/11/16 Low risk stress nuclear study with normal perfusion and normal left ventricular regional and global systolic function. EF 59 // Echo 09/23/16 - Moderate focal basal septal hypertrophy, EF 55-60, no RWMA, mild AS (mean gradient 12), mild MR, severe LAE, normal RVSF, mild TR, PASP 40  ? History of stroke   ? Hyperlipidemia   ? Hypertension   ? Leukocytosis   ? ? ?Past Surgical History: ?Past Surgical History:  ?Procedure Laterality Date  ? COLONOSCOPY WITH PROPOFOL N/A 09/25/2016  ? Procedure: COLONOSCOPY WITH PROPOFOL;  Surgeon: Rachael Fee, MD;  Location: [facility] ENDOSCOPY;  Service: Endoscopy;  Laterality: N/A;  ? ESOPHAGOGASTRODUODENOSCOPY N/A 09/24/2016  ? Procedure: ESOPHAGOGASTRODUODENOSCOPY (EGD);  Surgeon: Rachael Fee, MD;  Location: [facility] ENDOSCOPY;  Service: Endoscopy;  Laterality: N/A;  ? EYE SURGERY    ? ? ?Current Medications: ?No  outpatient medications have been marked as taking for the 02/16/22 encounter (Appointment) with O'Neal, Ronnald Ramp, MD.  ?  ? ?Allergies:    ?Codeine  ? ?Social History: ?Social History  ? ?Socioeconomic History  ? Marital status: Widowed  ?  Spouse name: Not on file  ? Number of children: 5  ? Years of education: Not on file  ? Highest education level: Not on file  ?Occupational History  ? Occupation: retired  ?Tobacco Use  ? Smoking status: Some Days  ?  Packs/day: 0.50  ?  Years: 45.00  ?  Pack years: 22.50  ?  Types: Cigarettes  ? Smokeless tobacco: Current  ?  Types: Chew  ? Tobacco comments:  ?  stopped smoking during hospital  ?Vaping Use  ? Vaping Use: Never used  ?Substance and Sexual Activity  ? Alcohol use: No  ? Drug use: No  ? Sexual activity: Not on file  ?Other Topics Concern  ? Not on file  ?Social History Narrative  ? Not on file  ? ?Social Determinants of Health  ? ?Financial Resource Strain: Not on file  ?Food Insecurity: Not on file  ?Transportation Needs: Not on file  ?Physical Activity: Not on file  ?Stress: Not on file  ?Social Connections: Not on file  ?  ? ?Family History: ?The patient's ***family history includes Bone cancer in his mother; Diabetes in his sister; Heart disease in his father. ? ?ROS:   ?All other ROS  reviewed and negative. Pertinent positives noted in the HPI.    ? ?EKGs/Labs/Other Studies Reviewed:   ?The following studies were personally reviewed by me today: ? ?EKG:  EKG is *** ordered today.  The ekg ordered today demonstrates ***, and was personally reviewed by me.  ? ?NM Stress 12/11/2016 ?Nuclear stress EF: 59%. ?The study is normal. ?This is a low risk study. ?There was no ST segment deviation noted during stress. ?  ?Low risk stress nuclear study with normal perfusion and normal left ventricular regional and global systolic function. ? ?TTE 09/23/2016 ? ?- Left ventricle: The cavity size was mildly dilated. There was  ?  moderate focal basal hypertrophy of the  septum. Systolic function  ?  was normal. The estimated ejection fraction was in the range of  ?  55% to 60%. Wall motion was normal; there were no regional wall  ?  motion abnormalities. Left ventricular diastolic function  ?  parameters were normal.  ?- Aortic valve: There was mild stenosis. There was no  ?  regurgitation. Peak velocity (S): 229 cm/s. Mean gradient (S): 12  ?  mm Hg. Valve area (VTI): 2.67 cm^2. Valve area (Vmax): 2.46 cm^2.  ?  Valve area (Vmean): 2.41 cm^2.  ?- Mitral valve: Transvalvular velocity was within the normal range.  ?  There was no evidence for stenosis. There was mild regurgitation.  ?- Left atrium: The atrium was severely dilated.  ?- Right ventricle: The cavity size was normal. Wall thickness was  ?  normal. Systolic function was normal.  ?- Tricuspid valve: There was mild regurgitation.  ?- Pulmonary arteries: Systolic pressure was increased. PA peak  ?  pressure: 40 mm Hg (S).  ? ?Recent Labs: ?No results found for requested labs within last 8760 hours.  ? ?Recent Lipid Panel ?   ?Component Value Date/Time  ? CHOL 120 09/24/2016 0750  ? TRIG 102 09/24/2016 0750  ? HDL 31 (L) 09/24/2016 0750  ? CHOLHDL 3.9 09/24/2016 0750  ? VLDL 20 09/24/2016 0750  ? LDLCALC 69 09/24/2016 0750  ? ? ?Physical Exam:   ?VS:  There were no vitals taken for this visit.   ?Wt Readings from Last 3 Encounters:  ?03/26/18 210 lb (95.3 kg)  ?02/19/18 210 lb (95.3 kg)  ?02/08/18 223 lb 12.8 oz (101.5 kg)  ?  ?General: Well nourished, well developed, in no acute distress ?Head: Atraumatic, normal size  ?Eyes: PEERLA, EOMI  ?Neck: Supple, no JVD ?Endocrine: No thryomegaly ?Cardiac: Normal S1, S2; RRR; no murmurs, rubs, or gallops ?Lungs: Clear to auscultation bilaterally, no wheezing, rhonchi or rales  ?Abd: Soft, nontender, no hepatomegaly  ?Ext: No edema, pulses 2+ ?Musculoskeletal: No deformities, BUE and BLE strength normal and equal ?Skin: Warm and dry, no rashes   ?Neuro: Alert and oriented to person,  place, time, and situation, CNII-XII grossly intact, no focal deficits  ?Psych: Normal mood and affect  ? ?ASSESSMENT:   ?Sadiel Mota is a 76 y.o. male who presents for the following: ?No diagnosis found. ? ?PLAN:   ?There are no diagnoses linked to this encounter. ? ?{Are you ordering a CV Procedure (e.g. stress test, cath, DCCV, TEE, etc)?   Press F2        :[id number]} ? ?Disposition: No follow-ups on file. ? ?Medication Adjustments/Labs and Tests Ordered: ?Current medicines are reviewed at length with the patient today.  Concerns regarding medicines are outlined above.  ?No orders of the defined types were placed in this encounter. ? ?  No orders of the defined types were placed in this encounter. ? ? ?There are no Patient Instructions on file for this visit.  ? ?Time Spent with Patient: I have spent a total of *** minutes with patient reviewing hospital notes, telemetry, EKGs, labs and examining the patient as well as establishing an assessment and plan that was discussed with the patient.  > 50% of time was spent in direct patient care. ? ?Signed, ?Gerri SporeWesley T. Flora Lipps'Neal, MD, [place]FACC ?[place]  [facility]  ?[address], [address] ?[place], [place] [address] ?[phone]  ?02/15/2022 6:47 PM    ? ?

## 2022-02-16 ENCOUNTER — Ambulatory Visit: Payer: Medicare Other | Admitting: Cardiovascular Disease

## 2022-02-16 DIAGNOSIS — I350 Nonrheumatic aortic (valve) stenosis: Secondary | ICD-10-CM

## 2022-02-16 DIAGNOSIS — I10 Essential (primary) hypertension: Secondary | ICD-10-CM

## 2022-02-16 DIAGNOSIS — E782 Mixed hyperlipidemia: Secondary | ICD-10-CM

## 2022-02-18 ENCOUNTER — Ambulatory Visit (INDEPENDENT_AMBULATORY_CARE_PROVIDER_SITE_OTHER): Payer: Medicare Other | Admitting: Podiatry

## 2022-02-18 ENCOUNTER — Ambulatory Visit (INDEPENDENT_AMBULATORY_CARE_PROVIDER_SITE_OTHER): Payer: Medicare Other

## 2022-02-18 DIAGNOSIS — R600 Localized edema: Secondary | ICD-10-CM

## 2022-02-18 DIAGNOSIS — M79672 Pain in left foot: Secondary | ICD-10-CM

## 2022-02-18 DIAGNOSIS — M79671 Pain in right foot: Secondary | ICD-10-CM

## 2022-02-18 NOTE — Progress Notes (Signed)
? ?  HPI: 76 y.o. male presenting today as a reestablish new patient for evaluation of bilateral lower extremity pain with swelling.  Patient states that he has a pair of compression socks but he is unable to put them on.  He recently had left knee replacement surgery which is causing swelling to the distal extremity.  He presents for further treatment evaluation ? ?Past Medical History:  ?Diagnosis Date  ? Anemia of chronic disease   ? COPD (chronic obstructive pulmonary disease) (HCC)   ? Diabetes mellitus   ? Diabetic neuropathy (HCC)   ? Diverticulosis   ? History of DVT (deep vein thrombosis)   ? prior tx with Xarelto  ? History of non-ST elevation myocardial infarction (NSTEMI) 10/06/2016  ? Occurred in setting of profound anemia and respiratory failure 09/2016 // Nuc stress test 12/11/16 Low risk stress nuclear study with normal perfusion and normal left ventricular regional and global systolic function. EF 59 // Echo 09/23/16 - Moderate focal basal septal hypertrophy, EF 55-60, no RWMA, mild AS (mean gradient 12), mild MR, severe LAE, normal RVSF, mild TR, PASP 40  ? History of stroke   ? Hyperlipidemia   ? Hypertension   ? Leukocytosis   ? ? ?Past Surgical History:  ?Procedure Laterality Date  ? COLONOSCOPY WITH PROPOFOL N/A 09/25/2016  ? Procedure: COLONOSCOPY WITH PROPOFOL;  Surgeon: Rachael Fee, MD;  Location: [facility] ENDOSCOPY;  Service: Endoscopy;  Laterality: N/A;  ? ESOPHAGOGASTRODUODENOSCOPY N/A 09/24/2016  ? Procedure: ESOPHAGOGASTRODUODENOSCOPY (EGD);  Surgeon: Rachael Fee, MD;  Location: [facility] ENDOSCOPY;  Service: Endoscopy;  Laterality: N/A;  ? EYE SURGERY    ? ? ?Allergies  ?Allergen Reactions  ? Codeine Itching  ? ?  ?Physical Exam: ?General: The patient is alert and oriented x3 in no acute distress. ? ?Dermatology: Skin is warm, dry and supple bilateral lower extremities. Negative for open lesions or macerations. ? ?Vascular: Palpable pedal pulses bilaterally. Capillary refill within normal  limits.  Negative for any significant edema or erythema ? ?Neurological: Light touch and protective threshold grossly intact ? ?Musculoskeletal Exam: No pedal deformities noted.  There is some moderate edema noted to the bilateral lower extremities and feet and ankles ? ? ?Assessment: ?1.  Bilateral lower extremity edema ? ? ?Plan of Care:  ?1. Patient evaluated.  ?2.  Compression anklets provided for the patient to wear daily ?3.  Recommend good supportive shoes and sneakers ?4.  Explained to the patient that with knee surgery sometimes potentially postoperatively that he can get some lower extremity swelling ?5.  Return to clinic as needed ? ?  ?  ?Felecia Shelling, DPM ?[facility] ? ?Dr. Felecia Shelling, DPM  ?  ?[address]                                        ?[place], [place] [address]                ?Office [phone]  ?Fax [phone] ? ? ? ? ?

## 2022-03-19 ENCOUNTER — Emergency Department
Admission: EM | Admit: 2022-03-19 | Discharge: 2022-03-19 | Discharge status: Against Medical Advice | Payer: Medicare Other | Attending: Emergency Medicine | Admitting: Emergency Medicine

## 2022-03-19 ENCOUNTER — Other Ambulatory Visit: Payer: Self-pay

## 2022-03-19 ENCOUNTER — Encounter: Payer: Self-pay | Admitting: Emergency Medicine

## 2022-03-19 ENCOUNTER — Emergency Department: Payer: Medicare Other

## 2022-03-19 DIAGNOSIS — E119 Type 2 diabetes mellitus without complications: Secondary | ICD-10-CM | POA: Insufficient documentation

## 2022-03-19 DIAGNOSIS — J449 Chronic obstructive pulmonary disease, unspecified: Secondary | ICD-10-CM | POA: Insufficient documentation

## 2022-03-19 DIAGNOSIS — R2242 Localized swelling, mass and lump, left lower limb: Secondary | ICD-10-CM | POA: Diagnosis not present

## 2022-03-19 DIAGNOSIS — I10 Essential (primary) hypertension: Secondary | ICD-10-CM | POA: Insufficient documentation

## 2022-03-19 DIAGNOSIS — M7989 Other specified soft tissue disorders: Secondary | ICD-10-CM

## 2022-03-19 LAB — COMPREHENSIVE METABOLIC PANEL
ALT: 18 U/L (ref 0–44)
AST: 22 U/L (ref 15–41)
Albumin: 3.6 g/dL (ref 3.5–5.0)
Alkaline Phosphatase: 65 U/L (ref 38–126)
Anion gap: 7 (ref 5–15)
BUN: 12 mg/dL (ref 8–23)
CO2: 26 mmol/L (ref 22–32)
Calcium: 9.3 mg/dL (ref 8.9–10.3)
Chloride: 104 mmol/L (ref 98–111)
Creatinine, Ser: 0.58 mg/dL — ABNORMAL LOW (ref 0.61–1.24)
GFR, Estimated: >60 mL/min (ref >60)
Glucose, Bld: 112 mg/dL — ABNORMAL HIGH (ref 70–99)
Potassium: 3.7 mmol/L (ref 3.5–5.1)
Sodium: 137 mmol/L (ref 135–145)
Total Bilirubin: 0.9 mg/dL (ref 0.3–1.2)
Total Protein: 7.6 g/dL (ref 6.5–8.1)

## 2022-03-19 LAB — CBC WITH DIFFERENTIAL/PLATELET
Abs Immature Granulocytes: 0.06 10*3/uL (ref 0.00–0.07)
Basophils Absolute: 0 10*3/uL (ref 0.0–0.1)
Basophils Relative: 0 %
Eosinophils Absolute: 0 10*3/uL (ref 0.0–0.5)
Eosinophils Relative: 0 %
HCT: 35 % — ABNORMAL LOW (ref 39.0–52.0)
Hemoglobin: 10.8 g/dL — ABNORMAL LOW (ref 13.0–17.0)
Immature Granulocytes: 1 %
Lymphocytes Relative: 15 %
Lymphs Abs: 1.2 10*3/uL (ref 0.7–4.0)
MCH: 25.9 pg — ABNORMAL LOW (ref 26.0–34.0)
MCHC: 30.9 g/dL (ref 30.0–36.0)
MCV: 83.9 fL (ref 80.0–100.0)
Monocytes Absolute: 0.8 10*3/uL (ref 0.1–1.0)
Monocytes Relative: 10 %
Neutro Abs: 6 10*3/uL (ref 1.7–7.7)
Neutrophils Relative %: 74 %
Platelets: 413 10*3/uL — ABNORMAL HIGH (ref 150–400)
RBC: 4.17 MIL/uL — ABNORMAL LOW (ref 4.22–5.81)
RDW: 18.9 % — ABNORMAL HIGH (ref 11.5–15.5)
WBC: 8.1 10*3/uL (ref 4.0–10.5)
nRBC: 0 % (ref 0.0–0.2)

## 2022-03-19 LAB — SEDIMENTATION RATE: Sed Rate: 100 mm/hr — ABNORMAL HIGH (ref 0–20)

## 2022-03-19 NOTE — ED Provider Triage Note (Signed)
Emergency Medicine Provider Triage Evaluation Note ? ?[name] , a 76 y.o. male  was evaluated in triage.  Pt complains of left leg swelling x5 days. History of DVT, no longer on anticoagulation. No fevers. No injuries. Able to weight bear. Knee surgery 09/15/22 ? ?Review of Systems  ?Positive: Left leg pain and swelling ?Negative: Fever, wounds ? ?Physical Exam  ?There were no vitals taken for this visit. ?Gen:   Awake, no distress   ?Resp:  Normal effort  ?MSK:   Left leg with 3+ pitting edema to knee. Faint erythema to medial lower leg. Foot is warm and well perfused, though cannot feel pulses due to swelling. No RLE swelling. ?Other:   ? ?Medical Decision Making  ?Medically screening exam initiated at 1:23 PM.  Appropriate orders placed.  Sebastien Jackson was informed that the remainder of the evaluation will be completed by another provider, this initial triage assessment does not replace that evaluation, and the importance of remaining in the ED until their evaluation is complete. ? ?  ?Jackelyn Hoehn, PA-C ?03/19/22 1328 ? ?

## 2022-03-19 NOTE — ED Triage Notes (Signed)
Brouht by [place] ems for swelling in legs.  Vss. ?

## 2022-03-19 NOTE — ED Triage Notes (Signed)
Pt here with left leg pain and swelling. Pt has warmth and redness to the affected leg, had left knee surgery on 09/15/21. Pt states he is still able to bear weight on that leg with the assistance on a walker. Pt denies fevers, N/V/D.  ?

## 2022-03-19 NOTE — ED Provider Notes (Signed)
? ?[facility] ?Provider Note ? ? ? Event Date/Time  ? First MD Initiated Contact with Patient 03/19/22 1558   ?  (approximate) ? ? ?History  ? ?Leg Pain ? ? ?HPI ? ?Jaquise Faux is a 76 y.o. male with a past medical history of COPD, DVT not on anticoagulation, hypertension who presents today for evaluation of left leg swelling.  Patient reports that this has been ongoing for the past 5 days.  He denies any fevers or chills.  He reports that he is still able to ambulate.  He reports that he has pain primarily on the medial aspect of his calf.  He reports that he has had a DVT in the past, however he has completed his anticoagulation.  No chest pain or shortness of breath.  He has not noticed any skin color changes or wounds. ? ?I reviewed the patient's chart.  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  He had a surgical joint washout, and was readmitted on 11/22 for a repeat washout.  He was subsequently admitted and started on broad-spectrum antibiotics including anaerobic coverage given gas on knee x-ray with Vanco, cefepime, and clindamycin.  He successfully underwent washout and placement of the knee polyethylene and placement of wound VAC afterwards.  Per ID direction, his antibiotics were transitioned to vancomycin, metronidazole, and rifampin given his prosthetic joint infection.  He was on IV antibiotics for total of 6 weeks from 10/09/2021 until 11/20/2021 followed by transition to oral suppression for 6 months. ? ?Patient Active Problem List  ? Diagnosis Date Noted  ? COPD (chronic obstructive pulmonary disease) (HCC) 02/08/2018  ? History of non-ST elevation myocardial infarction (NSTEMI) 10/06/2016  ? Diverticulosis of colon without hemorrhage   ? History of GI bleed 09/23/2016  ? Cardiac ischemia   ? Symptomatic anemia 09/15/2016  ? History of CVA (cerebrovascular accident) 09/15/2016  ? History of  DVT (deep vein thrombosis) 09/15/2016  ? Cellulitis and abscess 12/28/2015  ? Hyperlipidemia 12/28/2015  ? Leukocytosis 12/28/2015  ? Anemia of chronic disease 12/28/2015  ? Abscess of right hand including fingers 12/28/2015  ? Diabetes (HCC) 06/08/2015  ? HTN (hypertension) 06/08/2015  ? ? ? ?  ? ? ?Physical Exam  ? ?Triage Vital Signs: ?ED Triage Vitals  ?Enc Vitals Group  ?   BP 03/19/22 1325 (!) 157/74  ?   Pulse Rate 03/19/22 1325 66  ?   Resp 03/19/22 1325 20  ?   Temp 03/19/22 1325 98.1 ?F (36.7 ?C)  ?   Temp Source 03/19/22 1325 Oral  ?   SpO2 03/19/22 1325 98 %  ?   Weight 03/19/22 1326 210 lb (95.3 kg)  ?   Height 03/19/22 1326 5\' 6"  (1.676 m)  ?   Head Circumference --   ?   Peak Flow --   ?   Pain Score 03/19/22 1325 7  ?   Pain Loc --   ?   Pain Edu? --   ?   Excl. in GC? --   ? ? ?Most recent vital signs: ?Vitals:  ? 03/19/22 1325 03/19/22 1609  ?BP: (!) 157/74 (!) 150/70  ?Pulse: 66 68  ?Resp: 20 18  ?Temp: 98.1 ?F (36.7 ?C)   ?SpO2: 98% 98%  ? ? ?General: Awake, no distress.  ?CV:  Good peripheral perfusion.  Foot is warm and well-perfused. ?Resp:  Normal effort.  ?Abd:  No distention.  ?Other:  LLE: multiple surgical scars noted over left knee. Superior medial portion of lower leg with TTP, and pitting edema throughout entirety of leg and foot. Mildly warmth touch, no overt erythema. No active drainage. Full and normal AROM and PROM of knee and ankle. Foot is warm and well perfused, normal capillary refill ? ? ?ED Results / Procedures / Treatments  ? ?Labs ?(all labs ordered are listed, but only abnormal results are displayed) ?Labs Reviewed  ?CBC WITH DIFFERENTIAL/PLATELET - Abnormal; Notable for the following components:  ?    Result Value  ? RBC 4.17 (*)   ? Hemoglobin 10.8 (*)   ? HCT 35.0 (*)   ? MCH 25.9 (*)   ? RDW 18.9 (*)   ? Platelets 413 (*)   ? All other components within normal limits  ?COMPREHENSIVE METABOLIC PANEL - Abnormal; Notable for the following components:  ? Glucose, Bld 112  (*)   ? Creatinine, Ser 0.58 (*)   ? All other components within normal limits  ?SEDIMENTATION RATE - Abnormal; Notable for the following components:  ? Sed Rate 100 (*)   ? All other components within normal limits  ?C-REACTIVE PROTEIN  ? ? ? ?EKG ? ? ? ? ?RADIOLOGY ?Duplex ultrasound reviewed and I agree with radiologist findings. ? ? ? ?PROCEDURES: ? ?Critical Care performed:  ? ?Procedures ? ? ?MEDICATIONS ORDERED IN ED: ?Medications - No data to display ? ? ?IMPRESSION / MDM / ASSESSMENT AND PLAN / ED COURSE  ?I reviewed the triage vital signs and the nursing notes. ? ? ?Differential diagnosis includes, but is not limited to, DVT, infection, hematoma, abscess.  Patient has a complicated left lower extremity history.  Duplex ultrasound without acute DVT, however this does reveal a large 11.3 x 4 x 7.0 cm fluid collection in the anterior proximal mid calf.  Normal white blood cell count.  I had ordered a CT with contrast for further evaluation of his large fluid collection in his leg. Patient has had multiple procedures on this leg, including two "necrotizing" infections per chart review.  Currently no pain out of proportion, crepitus, hemodynamic instability, or fever to suggest necrotizing infection.  He is ambulatory with a steady gait.  No warmth noted to his knee, normal range of motion of his knee, however this is a prosthetic joint and may present differently.  Swelling is distal to knee. I am concerned about joint infection vs abscess vs myositis vs deep space infection. I expressed my concerns to the patient. I recommended CT with contrast and additional bloodwork and likely admission to the hospital for probable procedure and abx. He was reluctant to undergo further workup,  though he eventually agreed after I expressed my sincere concerns about his leg.  However, shortly thereafter, I was informed by the RN that the patient had eloped. He did not notify anyone prior to departure. I was not notified  until after the patient had left the department.  ? ? ?FINAL CLINICAL IMPRESSION(S) / ED DIAGNOSES  ? ?Final diagnoses:  ?Left leg swelling  ? ? ? ?Rx / DC Orders  ? ?ED Discharge Orders   ? ? None  ? ?  ? ? ? ?Note:  This document was prepared using Dragon voice recognition software and may include unintentional dictation errors. ?  ?Jackelyn Hoehn, PA-C ?03/19/22 2023 ? ?  ?Chesley Noon, MD ?03/20/22 1623 ? ?

## 2022-05-01 ENCOUNTER — Ambulatory Visit (INDEPENDENT_AMBULATORY_CARE_PROVIDER_SITE_OTHER): Payer: Medicare Other | Admitting: Podiatry

## 2022-05-01 ENCOUNTER — Encounter: Payer: Self-pay | Admitting: Podiatry

## 2022-05-01 DIAGNOSIS — M779 Enthesopathy, unspecified: Secondary | ICD-10-CM | POA: Diagnosis not present

## 2022-05-01 MED ORDER — TRIAMCINOLONE ACETONIDE 10 MG/ML IJ SUSP
10.0000 mg | Freq: Once | INTRAMUSCULAR | Status: AC
  Administered 2022-05-01: 10 mg

## 2022-05-04 NOTE — Progress Notes (Signed)
Subjective:   Patient ID: Gregory Walker, male   DOB: 76 y.o.   MRN: [id number]   HPI Patient presents stating he is getting pain on the side of his foot and its been sore and making it hard to walk at times does not remember specific injury   ROS      Objective:  Physical Exam  Neurovascular status unchanged with patient found to have inflammation pain lateral side left foot around the fifth metatarsal base and slightly distal to this point     Assessment:  Acute inflammation left with inflammation present     Plan:  Reviewed condition recommended careful injection treatment explained risk of this patient wants injection understanding risk and today I did sterile prep and injected the peroneal near insertion into the sheath 3 mg dexamethasone Kenalog 5 mg Xylocaine advised on ice therapy anti-inflammatories and patient will be seen back to recheck  X-rays were negative for signs of fracture or bony injury associated with this condition

## 2023-02-18 ENCOUNTER — Ambulatory Visit (INDEPENDENT_AMBULATORY_CARE_PROVIDER_SITE_OTHER): Payer: 59 | Admitting: Podiatry

## 2023-02-18 DIAGNOSIS — B351 Tinea unguium: Secondary | ICD-10-CM | POA: Diagnosis not present

## 2023-02-18 DIAGNOSIS — M79609 Pain in unspecified limb: Secondary | ICD-10-CM

## 2023-02-18 DIAGNOSIS — R600 Localized edema: Secondary | ICD-10-CM

## 2023-02-19 NOTE — Progress Notes (Signed)
Subjective:   Patient ID: Gregory Walker, male   DOB: 77 y.o.   MRN: [id number]   HPI Patient presents with a lot of swelling of both his feet and in poor health and has nail disease and other pathology   ROS      Objective:  Physical Exam  Neurovascular status unchanged with edema in the lower legs bilateral that is been present for a long time he is on fluid pill and has negative Denna Haggard' sign noted with thick toenails also noted     Assessment:  Very poor health individual with inflammation and mycotic nail infection     Plan:  Unfortunately nothing we can do about the swelling but continuing the fluid pills did debride nails courtesy discussed at length with him the elevation he can attempt and other things but at this point he will probably have the swelling

## 2023-12-08 ENCOUNTER — Ambulatory Visit: Payer: 59 | Admitting: Podiatry

## 2024-01-31 DIAGNOSIS — Z79899 Other long term (current) drug therapy: Secondary | ICD-10-CM | POA: Diagnosis not present

## 2024-01-31 DIAGNOSIS — M25561 Pain in right knee: Secondary | ICD-10-CM | POA: Diagnosis not present

## 2024-01-31 DIAGNOSIS — M25562 Pain in left knee: Secondary | ICD-10-CM | POA: Diagnosis not present

## 2024-02-02 DIAGNOSIS — Z79899 Other long term (current) drug therapy: Secondary | ICD-10-CM | POA: Diagnosis not present

## 2024-06-28 ENCOUNTER — Ambulatory Visit (INDEPENDENT_AMBULATORY_CARE_PROVIDER_SITE_OTHER): Admitting: Podiatry

## 2024-06-28 ENCOUNTER — Encounter: Payer: Self-pay | Admitting: Podiatry

## 2024-06-28 DIAGNOSIS — M7752 Other enthesopathy of left foot: Secondary | ICD-10-CM | POA: Diagnosis not present

## 2024-06-28 DIAGNOSIS — M79609 Pain in unspecified limb: Secondary | ICD-10-CM | POA: Diagnosis not present

## 2024-06-28 DIAGNOSIS — B351 Tinea unguium: Secondary | ICD-10-CM

## 2024-06-28 MED ORDER — TRIAMCINOLONE ACETONIDE 10 MG/ML IJ SUSP
10.0000 mg | Freq: Once | INTRAMUSCULAR | Status: AC
  Administered 2024-06-28 (×2): 10 mg via INTRA_ARTICULAR

## 2024-06-28 NOTE — Progress Notes (Signed)
 Subjective:   Patient ID: Gregory Walker, male   DOB: 78 y.o.   MRN: [id number]   HPI Patient presents with a lot of pain in the outside of the left ankle with no history of injury and nail disease 1-5 both feet that he has not been able to take care of.  States she has not turned his ankle that he knows of   ROS      Objective:  Physical Exam  Neurovascular status unchanged from previous visit with inflammation fluid of the sinus tarsi left that is moderately painful when pressed and thick yellow brittle nailbeds 1-5 both feet     Assessment:  Mycotic nail infection with pain 1-5 both feet with inflammatory capsulitis sinus tarsi left     Plan:  H&P reviewed and I did discuss condition and at this point I did sterile prep and injected the sinus tarsi 3 mg Dexasone Kenalog  5 mg Liken applied sterile dressing and then for the nails I debrided nailbeds 1-5 both feet slight bleeding fifth nailbed which had a dressing applied to it

## 2024-08-24 ENCOUNTER — Other Ambulatory Visit (HOSPITAL_BASED_OUTPATIENT_CLINIC_OR_DEPARTMENT_OTHER): Payer: Self-pay | Admitting: Internal Medicine

## 2024-08-24 DIAGNOSIS — Z122 Encounter for screening for malignant neoplasm of respiratory organs: Secondary | ICD-10-CM

## 2024-09-20 ENCOUNTER — Ambulatory Visit: Admitting: Podiatry

## 2024-11-15 ENCOUNTER — Ambulatory Visit (HOSPITAL_BASED_OUTPATIENT_CLINIC_OR_DEPARTMENT_OTHER)
Admission: RE | Admit: 2024-11-15 | Discharge: 2024-11-15 | Disposition: A | Discharge status: Home / Self Care | Source: Ambulatory Visit | Attending: Internal Medicine | Admitting: Internal Medicine

## 2024-11-15 DIAGNOSIS — J439 Emphysema, unspecified: Secondary | ICD-10-CM | POA: Insufficient documentation

## 2024-11-15 DIAGNOSIS — Z122 Encounter for screening for malignant neoplasm of respiratory organs: Secondary | ICD-10-CM | POA: Insufficient documentation

## 2024-11-15 DIAGNOSIS — F17210 Nicotine dependence, cigarettes, uncomplicated: Secondary | ICD-10-CM | POA: Insufficient documentation

## 2024-11-15 DIAGNOSIS — I2510 Atherosclerotic heart disease of native coronary artery without angina pectoris: Secondary | ICD-10-CM | POA: Diagnosis not present

## 2024-11-15 DIAGNOSIS — M4854XA Collapsed vertebra, not elsewhere classified, thoracic region, initial encounter for fracture: Secondary | ICD-10-CM | POA: Diagnosis not present

## 2024-11-15 DIAGNOSIS — I700 Atherosclerosis of aorta: Secondary | ICD-10-CM | POA: Insufficient documentation

## 2024-12-27 ENCOUNTER — Ambulatory Visit: Admitting: Podiatry
